# Patient Record
Sex: Female | Born: 1960 | Race: White | Hispanic: No | Marital: Married | Smoking: Current some day smoker
Health system: Southern US, Community
[De-identification: ages and names within clinical notes are randomized; demographics above are authoritative.]

## PROBLEM LIST (undated history)

## (undated) DIAGNOSIS — M797 Fibromyalgia: Secondary | ICD-10-CM

## (undated) DIAGNOSIS — K659 Peritonitis, unspecified: Secondary | ICD-10-CM

## (undated) DIAGNOSIS — I340 Nonrheumatic mitral (valve) insufficiency: Secondary | ICD-10-CM

## (undated) DIAGNOSIS — E041 Nontoxic single thyroid nodule: Secondary | ICD-10-CM

## (undated) DIAGNOSIS — F449 Dissociative and conversion disorder, unspecified: Secondary | ICD-10-CM

## (undated) DIAGNOSIS — F32A Depression, unspecified: Secondary | ICD-10-CM

## (undated) DIAGNOSIS — G4730 Sleep apnea, unspecified: Secondary | ICD-10-CM

## (undated) DIAGNOSIS — R87629 Unspecified abnormal cytological findings in specimens from vagina: Secondary | ICD-10-CM

## (undated) HISTORY — PX: ANTERIOR / POSTERIOR COMBINED FUSION CERVICAL SPINE: SUR627

## (undated) HISTORY — DX: Sleep apnea, unspecified: G47.30

## (undated) HISTORY — PX: BREAST BIOPSY: SHX20

## (undated) HISTORY — PX: ANTERIOR LAT LUMBAR FUSION: SHX1168

## (undated) HISTORY — PX: NOSE SURGERY: SHX723

## (undated) HISTORY — PX: ABDOMINAL HYSTERECTOMY: SHX81

## (undated) HISTORY — DX: Nonrheumatic mitral (valve) insufficiency: I34.0

## (undated) HISTORY — PX: TUMOR REMOVAL: SHX12

## (undated) HISTORY — PX: INGUINAL HERNIA REPAIR: SUR1180

---

## 2012-03-06 DIAGNOSIS — J45909 Unspecified asthma, uncomplicated: Secondary | ICD-10-CM | POA: Insufficient documentation

## 2014-03-06 DIAGNOSIS — G43909 Migraine, unspecified, not intractable, without status migrainosus: Secondary | ICD-10-CM | POA: Insufficient documentation

## 2014-03-06 DIAGNOSIS — I10 Essential (primary) hypertension: Secondary | ICD-10-CM | POA: Insufficient documentation

## 2014-08-06 DIAGNOSIS — G4733 Obstructive sleep apnea (adult) (pediatric): Secondary | ICD-10-CM | POA: Insufficient documentation

## 2017-06-28 DIAGNOSIS — K760 Fatty (change of) liver, not elsewhere classified: Secondary | ICD-10-CM | POA: Insufficient documentation

## 2018-03-29 DIAGNOSIS — E782 Mixed hyperlipidemia: Secondary | ICD-10-CM | POA: Insufficient documentation

## 2018-03-29 DIAGNOSIS — E559 Vitamin D deficiency, unspecified: Secondary | ICD-10-CM | POA: Insufficient documentation

## 2018-03-29 DIAGNOSIS — F324 Major depressive disorder, single episode, in partial remission: Secondary | ICD-10-CM | POA: Insufficient documentation

## 2018-03-29 DIAGNOSIS — R7301 Impaired fasting glucose: Secondary | ICD-10-CM | POA: Insufficient documentation

## 2018-06-06 DIAGNOSIS — M519 Unspecified thoracic, thoracolumbar and lumbosacral intervertebral disc disorder: Secondary | ICD-10-CM | POA: Insufficient documentation

## 2018-07-12 DIAGNOSIS — E669 Obesity, unspecified: Secondary | ICD-10-CM | POA: Insufficient documentation

## 2020-12-10 DIAGNOSIS — K5904 Chronic idiopathic constipation: Secondary | ICD-10-CM | POA: Insufficient documentation

## 2021-03-13 DIAGNOSIS — M546 Pain in thoracic spine: Secondary | ICD-10-CM | POA: Insufficient documentation

## 2021-03-25 DIAGNOSIS — R079 Chest pain, unspecified: Secondary | ICD-10-CM | POA: Insufficient documentation

## 2021-03-25 DIAGNOSIS — R030 Elevated blood-pressure reading, without diagnosis of hypertension: Secondary | ICD-10-CM | POA: Insufficient documentation

## 2021-03-25 DIAGNOSIS — M62830 Muscle spasm of back: Secondary | ICD-10-CM | POA: Insufficient documentation

## 2021-03-27 DIAGNOSIS — M9900 Segmental and somatic dysfunction of head region: Secondary | ICD-10-CM | POA: Insufficient documentation

## 2021-03-27 DIAGNOSIS — M9901 Segmental and somatic dysfunction of cervical region: Secondary | ICD-10-CM | POA: Insufficient documentation

## 2021-03-27 DIAGNOSIS — M9907 Segmental and somatic dysfunction of upper extremity: Secondary | ICD-10-CM | POA: Insufficient documentation

## 2021-04-10 DIAGNOSIS — M25512 Pain in left shoulder: Secondary | ICD-10-CM | POA: Insufficient documentation

## 2021-07-28 DIAGNOSIS — Z1331 Encounter for screening for depression: Secondary | ICD-10-CM | POA: Insufficient documentation

## 2021-10-13 DIAGNOSIS — J0110 Acute frontal sinusitis, unspecified: Secondary | ICD-10-CM | POA: Insufficient documentation

## 2022-01-27 ENCOUNTER — Ambulatory Visit: Payer: Self-pay | Admitting: *Deleted

## 2022-01-27 NOTE — Telephone Encounter (Signed)
Patient experiencing for 2 months feeling nausea,  experiencing service constipation possible infection pylori. Reason for Disposition  Nausea lasts > 1 week  Answer Assessment - Initial Assessment Questions 1. NAUSEA SEVERITY: "How bad is the nausea?" (e.g., mild, moderate, severe; dehydration, weight loss)   - MILD: loss of appetite without change in eating habits   - MODERATE: decreased oral intake without significant weight loss, dehydration, or malnutrition   - SEVERE: inadequate caloric or fluid intake, significant weight loss, symptoms of dehydration     moderate 2. ONSET: "When did the nausea begin?"     2 months 3. VOMITING: "Any vomiting?" If Yes, ask: "How many times today?"     Yes- not daily- once /week 4. RECURRENT SYMPTOM: "Have you had nausea before?" If Yes, ask: "When was the last time?" "What happened that time?"     H pylori 5. CAUSE: "What do you think is causing the nausea?"     constipation 6. PREGNANCY: "Is there any chance you are pregnant?" (e.g., unprotected intercourse, missed birth control pill, broken condom)     *No Answer*  Protocols used: Nausea-A-AH

## 2022-01-27 NOTE — Telephone Encounter (Signed)
°  Chief Complaint: nausea Symptoms: nausea, constipation, vomiting Frequency: 2 months Pertinent Negatives: Patient denies fever Disposition: [] ED /[x] Urgent Care (no appt availability in office) / [] Appointment(In office/virtual)/ []  [place] [facility]/ [] Home Care/ [] Refused Recommended Disposition /[] [place] [facility]/ []  Follow-up with PCP Additional Notes: Patient is new to area and has not established care- advised until she can be seen- UC - patient will check with her insurance and see what is covered

## 2022-04-08 DIAGNOSIS — R399 Unspecified symptoms and signs involving the genitourinary system: Secondary | ICD-10-CM | POA: Insufficient documentation

## 2022-04-08 DIAGNOSIS — F419 Anxiety disorder, unspecified: Secondary | ICD-10-CM | POA: Insufficient documentation

## 2022-04-08 DIAGNOSIS — F5101 Primary insomnia: Secondary | ICD-10-CM | POA: Insufficient documentation

## 2022-04-08 DIAGNOSIS — N319 Neuromuscular dysfunction of bladder, unspecified: Secondary | ICD-10-CM | POA: Insufficient documentation

## 2022-04-08 DIAGNOSIS — J309 Allergic rhinitis, unspecified: Secondary | ICD-10-CM | POA: Insufficient documentation

## 2022-04-08 DIAGNOSIS — K219 Gastro-esophageal reflux disease without esophagitis: Secondary | ICD-10-CM | POA: Insufficient documentation

## 2022-04-08 DIAGNOSIS — F410 Panic disorder [episodic paroxysmal anxiety] without agoraphobia: Secondary | ICD-10-CM | POA: Insufficient documentation

## 2022-04-08 DIAGNOSIS — F1320 Sedative, hypnotic or anxiolytic dependence, uncomplicated: Secondary | ICD-10-CM | POA: Insufficient documentation

## 2022-04-08 DIAGNOSIS — G4700 Insomnia, unspecified: Secondary | ICD-10-CM | POA: Insufficient documentation

## 2022-04-08 DIAGNOSIS — F331 Major depressive disorder, recurrent, moderate: Secondary | ICD-10-CM | POA: Insufficient documentation

## 2022-04-16 ENCOUNTER — Emergency Department
Admission: EM | Admit: 2022-04-16 | Discharge: 2022-04-16 | Disposition: A | Discharge status: Home / Self Care | Payer: Medicare Other | Attending: Emergency Medicine | Admitting: Emergency Medicine

## 2022-04-16 ENCOUNTER — Other Ambulatory Visit: Payer: Self-pay

## 2022-04-16 ENCOUNTER — Emergency Department: Payer: Medicare Other

## 2022-04-16 DIAGNOSIS — R220 Localized swelling, mass and lump, head: Secondary | ICD-10-CM | POA: Insufficient documentation

## 2022-04-16 DIAGNOSIS — Z90710 Acquired absence of both cervix and uterus: Secondary | ICD-10-CM | POA: Diagnosis not present

## 2022-04-16 DIAGNOSIS — F32A Depression, unspecified: Secondary | ICD-10-CM | POA: Diagnosis not present

## 2022-04-16 DIAGNOSIS — R102 Pelvic and perineal pain: Secondary | ICD-10-CM | POA: Diagnosis not present

## 2022-04-16 DIAGNOSIS — Z79899 Other long term (current) drug therapy: Secondary | ICD-10-CM | POA: Diagnosis not present

## 2022-04-16 DIAGNOSIS — R42 Dizziness and giddiness: Secondary | ICD-10-CM | POA: Insufficient documentation

## 2022-04-16 DIAGNOSIS — R300 Dysuria: Secondary | ICD-10-CM | POA: Insufficient documentation

## 2022-04-16 DIAGNOSIS — M797 Fibromyalgia: Secondary | ICD-10-CM | POA: Diagnosis not present

## 2022-04-16 HISTORY — DX: Fibromyalgia: M79.7

## 2022-04-16 HISTORY — DX: Nontoxic single thyroid nodule: E04.1

## 2022-04-16 HISTORY — DX: Peritonitis, unspecified: K65.9

## 2022-04-16 HISTORY — DX: Unspecified abnormal cytological findings in specimens from vagina: R87.629

## 2022-04-16 HISTORY — DX: Depression, unspecified: F32.A

## 2022-04-16 HISTORY — DX: Dissociative and conversion disorder, unspecified: F44.9

## 2022-04-16 LAB — BASIC METABOLIC PANEL
Anion gap: 8 (ref 5–15)
BUN: 26 mg/dL — ABNORMAL HIGH (ref 8–23)
CO2: 26 mmol/L (ref 22–32)
Calcium: 9.5 mg/dL (ref 8.9–10.3)
Chloride: 104 mmol/L (ref 98–111)
Creatinine, Ser: 0.85 mg/dL (ref 0.44–1.00)
GFR, Estimated: >60 mL/min (ref >60)
Glucose, Bld: 113 mg/dL — ABNORMAL HIGH (ref 70–99)
Potassium: 4.2 mmol/L (ref 3.5–5.1)
Sodium: 138 mmol/L (ref 135–145)

## 2022-04-16 LAB — CBC
HCT: 39 % (ref 36.0–46.0)
Hemoglobin: 12.8 g/dL (ref 12.0–15.0)
MCH: 29.2 pg (ref 26.0–34.0)
MCHC: 32.8 g/dL (ref 30.0–36.0)
MCV: 89 fL (ref 80.0–100.0)
Platelets: 417 10*3/uL — ABNORMAL HIGH (ref 150–400)
RBC: 4.38 MIL/uL (ref 3.87–5.11)
RDW: 13 % (ref 11.5–15.5)
WBC: 6.6 10*3/uL (ref 4.0–10.5)
nRBC: 0 % (ref 0.0–0.2)

## 2022-04-16 LAB — URINALYSIS, ROUTINE W REFLEX MICROSCOPIC
Bilirubin Urine: NEGATIVE
Glucose, UA: NEGATIVE mg/dL
Hgb urine dipstick: NEGATIVE
Ketones, ur: NEGATIVE mg/dL
Leukocytes,Ua: NEGATIVE
Nitrite: NEGATIVE
Protein, ur: NEGATIVE mg/dL
Specific Gravity, Urine: 1.01 (ref 1.005–1.030)
pH: 6 (ref 5.0–8.0)

## 2022-04-16 LAB — TRIGLYCERIDES: Triglycerides: 112 mg/dL (ref <150)

## 2022-04-16 MED ORDER — IOHEXOL 300 MG/ML  SOLN
100.0000 mL | Freq: Once | INTRAMUSCULAR | Status: AC | PRN
  Administered 2022-04-16: 100 mL via INTRAVENOUS
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
## 2022-04-16 NOTE — Discharge Instructions (Addendum)
Your lab tests and CT scan of the abdomen and pelvis were all okay today. ?

## 2022-04-16 NOTE — ED Triage Notes (Signed)
Pt here with dizziness that started 2 weeks ago but has gotten worse in the last 2 days. Pt also having urethra pain. Pt states she has a sudden short term memory loss and lower pelvic pain. Pt believes she had a UTI but her tests have come back normal. Pt also concerned about a bump on her forehead that has gotten larger and is hard. ?

## 2022-04-17 LAB — HEMOGLOBIN A1C
Hgb A1c MFr Bld: 6 % — ABNORMAL HIGH (ref 4.8–5.6)
Mean Plasma Glucose: 125.5 mg/dL

## 2022-04-17 LAB — LIPID PANEL
Cholesterol: 261 mg/dL — ABNORMAL HIGH (ref 0–200)
HDL: 70 mg/dL (ref >40)
LDL Cholesterol: 168 mg/dL — ABNORMAL HIGH (ref 0–99)
Total CHOL/HDL Ratio: 3.7 RATIO
Triglycerides: 114 mg/dL (ref <150)
VLDL: 23 mg/dL (ref 0–40)

## 2022-04-17 LAB — TSH: TSH: 0.859 u[IU]/mL (ref 0.350–4.500)

## 2022-05-18 ENCOUNTER — Other Ambulatory Visit: Payer: Self-pay | Admitting: Internal Medicine

## 2022-05-18 DIAGNOSIS — Z1231 Encounter for screening mammogram for malignant neoplasm of breast: Secondary | ICD-10-CM

## 2022-05-21 ENCOUNTER — Encounter: Payer: Self-pay | Admitting: Licensed Practical Nurse

## 2022-05-24 ENCOUNTER — Encounter: Payer: Self-pay | Admitting: Obstetrics

## 2022-05-24 ENCOUNTER — Other Ambulatory Visit (HOSPITAL_COMMUNITY)
Admission: RE | Admit: 2022-05-24 | Discharge: 2022-05-24 | Disposition: A | Discharge status: Home / Self Care | Payer: Medicare Other | Source: Ambulatory Visit | Attending: Obstetrics | Admitting: Obstetrics

## 2022-05-24 ENCOUNTER — Ambulatory Visit: Payer: Medicare Other | Admitting: Obstetrics

## 2022-05-24 VITALS — BP 122/82 | Resp 16 | Ht 63.0 in | Wt 185.2 lb

## 2022-05-24 DIAGNOSIS — Z8742 Personal history of other diseases of the female genital tract: Secondary | ICD-10-CM | POA: Diagnosis not present

## 2022-05-24 DIAGNOSIS — Z1231 Encounter for screening mammogram for malignant neoplasm of breast: Secondary | ICD-10-CM

## 2022-05-24 DIAGNOSIS — N6310 Unspecified lump in the right breast, unspecified quadrant: Secondary | ICD-10-CM | POA: Diagnosis not present

## 2022-05-24 DIAGNOSIS — R3989 Other symptoms and signs involving the genitourinary system: Secondary | ICD-10-CM

## 2022-05-24 DIAGNOSIS — R7303 Prediabetes: Secondary | ICD-10-CM

## 2022-05-24 DIAGNOSIS — T283XXA Burn of internal genitourinary organs, initial encounter: Secondary | ICD-10-CM | POA: Diagnosis not present

## 2022-05-24 DIAGNOSIS — Z90711 Acquired absence of uterus with remaining cervical stump: Secondary | ICD-10-CM | POA: Insufficient documentation

## 2022-05-24 DIAGNOSIS — R9389 Abnormal findings on diagnostic imaging of other specified body structures: Secondary | ICD-10-CM | POA: Diagnosis not present

## 2022-05-24 DIAGNOSIS — Z124 Encounter for screening for malignant neoplasm of cervix: Secondary | ICD-10-CM | POA: Diagnosis present

## 2022-05-24 DIAGNOSIS — R102 Pelvic and perineal pain: Secondary | ICD-10-CM | POA: Insufficient documentation

## 2022-05-24 DIAGNOSIS — N644 Mastodynia: Secondary | ICD-10-CM

## 2022-05-24 DIAGNOSIS — Z01411 Encounter for gynecological examination (general) (routine) with abnormal findings: Secondary | ICD-10-CM

## 2022-05-24 DIAGNOSIS — Z1151 Encounter for screening for human papillomavirus (HPV): Secondary | ICD-10-CM | POA: Insufficient documentation

## 2022-05-24 DIAGNOSIS — Z87898 Personal history of other specified conditions: Secondary | ICD-10-CM

## 2022-05-24 DIAGNOSIS — Z809 Family history of malignant neoplasm, unspecified: Secondary | ICD-10-CM

## 2022-05-24 LAB — POCT URINALYSIS DIPSTICK
Bilirubin, UA: NEGATIVE
Blood, UA: NEGATIVE
Glucose, UA: NEGATIVE
Ketones, UA: NEGATIVE
Leukocytes, UA: NEGATIVE
Nitrite, UA: NEGATIVE
Protein, UA: NEGATIVE
Spec Grav, UA: <=1.005 — AB (ref 1.010–1.025)
Urobilinogen, UA: 0.2 E.U./dL
pH, UA: 6 (ref 5.0–8.0)

## 2022-05-24 MED ORDER — FLUCONAZOLE 150 MG PO TABS: 150.0000 mg | ORAL_TABLET | Freq: Once | ORAL | 0 refills | Status: AC
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
## 2022-05-24 NOTE — Patient Instructions (Signed)
Have a great year! Please call with any concerns. Don't forget to wear your seatbelt everyday! If you are not signed up on MyChart, please ask [place] how to sign up for it!   In a world where you can be anything, please be kind.   There is no height or weight on file to calculate BMI.  A Healthy Lifestyle: Care Instructions Your Care Instructions  A healthy lifestyle can help you feel good, stay at a healthy weight, and have plenty of energy for both work and play. A healthy lifestyle is something you can share with your whole family. A healthy lifestyle also can lower your risk for serious health problems, such as high blood pressure, heart disease, and diabetes. You can follow a few steps listed below to improve your health and the health of your family. Follow-up care is a key part of your treatment and safety. Be sure to make and go to all appointments, and call your doctor if you are having problems. It's also a good idea to know your test results and keep a list of the medicines you take. How can you care for yourself at home? Do not eat too much sugar, fat, or fast foods. You can still have dessert and treats now and then. The goal is moderation. Start small to improve your eating habits. Pay attention to portion sizes, drink less juice and soda pop, and eat more fruits and vegetables. Eat a healthy amount of food. A 3-ounce serving of meat, for example, is about the size of a deck of cards. Fill the rest of your plate with vegetables and whole grains. Limit the amount of soda and sports drinks you have every day. Drink more water when you are thirsty. Eat at least 5 servings of fruits and vegetables every day. It may seem like a lot, but it is not hard to reach this goal. A serving or helping is 1 piece of fruit, 1 cup of vegetables, or 2 cups of leafy, raw vegetables. Have an apple or some carrot sticks as an afternoon snack instead of a candy bar. Try to have fruits and/or vegetables at  every meal. Make exercise part of your daily routine. You may want to start with simple activities, such as walking, bicycling, or slow swimming. Try to be active 30 to 60 minutes every day. You do not need to do all 30 to 60 minutes all at once. For example, you can exercise 3 times a day for 10 or 20 minutes. Moderate exercise is safe for most people, but it is always a good idea to talk to your doctor before starting an exercise program. Keep moving. Mow the lawn, work in the garden, or [employer]. Take the stairs instead of the elevator at work. If you smoke, quit. People who smoke have an increased risk for heart attack, stroke, cancer, and other lung illnesses. Quitting is hard, but there are ways to boost your chance of quitting tobacco for good. Use nicotine gum, patches, or lozenges. Ask your doctor about stop-smoking programs and medicines. Keep trying. In addition to reducing your risk of diseases in the future, you will notice some benefits soon after you stop using tobacco. If you have shortness of breath or asthma symptoms, they will likely get better within a few weeks after you quit. Limit how much alcohol you drink. Moderate amounts of alcohol (up to 2 drinks a day for men, 1 drink a day for women) are okay. But drinking  too much can lead to liver problems, high blood pressure, and other health problems. Family health If you have a family, there are many things you can do together to improve your health. Eat meals together as a family as often as possible. Eat healthy foods. This includes fruits, vegetables, lean meats and dairy, and whole grains. Include your family in your fitness plan. Most people think of activities such as jogging or tennis as the way to fitness, but there are many ways you and your family can be more active. Anything that makes you breathe hard and gets your heart pumping is exercise. Here are some tips: Walk to do errands or to take your child to school or  the bus. Go for a family bike ride after dinner instead of watching TV. Care instructions adapted under license by your healthcare professional. This care instruction is for use with your licensed healthcare professional. If you have questions about a medical condition or this instruction, always ask your healthcare professional. [place] any warranty or liability for your use of this information.

## 2022-05-25 ENCOUNTER — Telehealth: Payer: Self-pay

## 2022-05-25 NOTE — Telephone Encounter (Signed)
CALLED PATIENT NO ANSWER LEFT VOICEMAIL FOR A CALL BACK ? ?

## 2022-05-26 LAB — CYTOLOGY - PAP
Comment: NEGATIVE
Diagnosis: NEGATIVE
High risk HPV: NEGATIVE

## 2022-05-27 ENCOUNTER — Telehealth: Payer: Self-pay

## 2022-05-27 LAB — NUSWAB VAGINITIS (VG)
Candida albicans, NAA: NEGATIVE
Candida glabrata, NAA: NEGATIVE
Trich vag by NAA: NEGATIVE

## 2022-05-27 NOTE — Telephone Encounter (Signed)
PHONE NOTE

## 2022-05-27 NOTE — Telephone Encounter (Signed)
Per [facility], pt has medicare which won't cover her genetic testing since she personally doesn't have a hx of cancer. Pt aware.

## 2022-05-31 LAB — MYCOPLASMA / UREAPLASMA CULTURE
Mycoplasma hominis Culture: NEGATIVE
Ureaplasma urealyticum: NEGATIVE

## 2022-06-02 ENCOUNTER — Ambulatory Visit
Admission: RE | Admit: 2022-06-02 | Discharge: 2022-06-02 | Disposition: A | Discharge status: Home / Self Care | Payer: Medicare Other | Source: Ambulatory Visit | Attending: Obstetrics | Admitting: Obstetrics

## 2022-06-02 DIAGNOSIS — R3989 Other symptoms and signs involving the genitourinary system: Secondary | ICD-10-CM | POA: Diagnosis present

## 2022-06-03 ENCOUNTER — Telehealth: Payer: Self-pay

## 2022-06-03 NOTE — Telephone Encounter (Signed)
CALLED PATIENT NO ANSWER LEFT VOICEMAIL FOR A CALL BACK ? ?

## 2022-06-24 ENCOUNTER — Telehealth: Payer: Self-pay

## 2022-06-24 NOTE — Telephone Encounter (Signed)
Pt aware.

## 2022-06-28 ENCOUNTER — Encounter: Payer: Self-pay | Admitting: Urology

## 2022-06-28 ENCOUNTER — Ambulatory Visit: Payer: Medicare Other | Admitting: Urology

## 2022-06-28 VITALS — BP 172/84 | HR 89 | Ht 63.0 in | Wt 184.0 lb

## 2022-06-28 DIAGNOSIS — F329 Major depressive disorder, single episode, unspecified: Secondary | ICD-10-CM | POA: Insufficient documentation

## 2022-06-28 DIAGNOSIS — R300 Dysuria: Secondary | ICD-10-CM

## 2022-06-28 DIAGNOSIS — T7422XA Child sexual abuse, confirmed, initial encounter: Secondary | ICD-10-CM | POA: Insufficient documentation

## 2022-06-28 DIAGNOSIS — Z8659 Personal history of other mental and behavioral disorders: Secondary | ICD-10-CM | POA: Insufficient documentation

## 2022-06-28 LAB — MICROSCOPIC EXAMINATION
Bacteria, UA: NONE SEEN
Epithelial Cells (non renal): NONE SEEN /hpf (ref 0–10)

## 2022-06-28 LAB — URINALYSIS, COMPLETE
Bilirubin, UA: NEGATIVE
Glucose, UA: NEGATIVE
Ketones, UA: NEGATIVE
Leukocytes,UA: NEGATIVE
Nitrite, UA: NEGATIVE
Protein,UA: NEGATIVE
RBC, UA: NEGATIVE
Specific Gravity, UA: <1.005 — ABNORMAL LOW (ref 1.005–1.030)
Urobilinogen, Ur: 0.2 mg/dL (ref 0.2–1.0)
pH, UA: 6.5 (ref 5.0–7.5)

## 2022-06-28 MED ORDER — ESTRADIOL 0.1 MG/GM VA CREA: TOPICAL_CREAM | VAGINAL | 12 refills | Status: DC

## 2022-06-28 NOTE — Patient Instructions (Addendum)
Estrogen Cream Instruction  Discard applicator  Apply pea sized amount to tip of finger to urethra before bed. Wash hands well after application. Use Monday, Wednesday and Friday   Cystoscopy Cystoscopy is a procedure that is used to help diagnose and sometimes treat conditions that affect the lower urinary tract. The lower urinary tract includes the bladder and the urethra. The urethra is the tube that drains urine from the bladder. Cystoscopy is done using a thin, tube-shaped instrument with a light and camera at the end (cystoscope). The cystoscope may be hard or flexible, depending on the goal of the procedure. The cystoscope is inserted through the urethra, into the bladder. Cystoscopy may be recommended if you have: Urinary tract infections that keep coming back. Blood in the urine (hematuria). An inability to control when you urinate (urinary incontinence) or an overactive bladder. Unusual cells found in a urine sample. A blockage in the urethra, such as a urinary stone. Painful urination. An abnormality in the bladder found during an intravenous pyelogram (IVP) or CT scan. What are the risks? Generally, this is a safe procedure. However, problems may occur, including: Infection. Bleeding.  What happens during the procedure?  You will be given one or more of the following: A medicine to numb the area (local anesthetic). The area around the opening of your urethra will be cleaned. The cystoscope will be passed through your urethra into your bladder. Germ-free (sterile) fluid will flow through the cystoscope to fill your bladder. The fluid will stretch your bladder so that your health care provider can clearly examine your bladder walls. Your doctor will look at the urethra and bladder. The cystoscope will be removed The procedure may vary among health care providers  What can I expect after the procedure? After the procedure, it is common to have: Some soreness or pain in your  urethra. Urinary symptoms. These include: Mild pain or burning when you urinate. Pain should stop within a few minutes after you urinate. This may last for up to a few days after the procedure. A small amount of blood in your urine for several days. Feeling like you need to urinate but producing only a small amount of urine. Follow these instructions at home: General instructions Return to your normal activities as told by your health care provider.  Drink plenty of fluids after the procedure. Keep all follow-up visits as told by your health care provider. This is important. Contact a health care provider if you: Have pain that gets worse or does not get better with medicine, especially pain when you urinate lasting longer than 72 hours after the procedure. Have trouble urinating. Get help right away if you: Have blood clots in your urine. Have a fever or chills. Are unable to urinate. Summary Cystoscopy is a procedure that is used to help diagnose and sometimes treat conditions that affect the lower urinary tract. Cystoscopy is done using a thin, tube-shaped instrument with a light and camera at the end. After the procedure, it is common to have some soreness or pain in your urethra. It is normal to have blood in your urine after the procedure.  If you were prescribed an antibiotic medicine, take it as told by your health care provider.  This information is not intended to replace advice given to you by your health care provider. Make sure you discuss any questions you have with your health care provider. Document Revised: 11/14/2018 Document Reviewed: 11/14/2018 Elsevier Patient Education  [employer].

## 2022-06-28 NOTE — Progress Notes (Signed)
06/28/2022 9:16 AM   Kaitlyn Frazier [birth year] [id number]  Referring provider: Perrin Maltese, MD [place],  [place] [address]  Chief Complaint  Patient presents with   New Patient (Initial Visit)    ER follow-up bladder pain    HPI: I was consulted to assess the patient for urethral burning.  It lessens when she voids.  It is daily.  It comes and goes.  It is worse after stimulation sexually.  She uses a herbal supplement once a week for but does not feel a lot of dry she has had negative urines  At baseline she leaks sometimes with urgency and coughing sneezing but does not wear a pad.  She voids every 1-2 hours and has rare nocturia.  She has had neck and low back surgery  I do not think she has had bladder surgery when she had her hysterectomy  She had a recent negative pelvic ultrasound and CT scan.  Patient gets nonspecific bloating but not daily that radiates to both sides into her back.  She has a history of fibromyalgia.  Lyrica has been increased in the last month and things have been improving  She is to get bladder infection.  No kidney stones.  Bowel movements normal   PMH: Past Medical History:  Diagnosis Date   Abnormal vaginal Pap smear    Depression    Fibromyalgia    hx of Conversion disorder    hx of Peritonitis (HCC)    Thyroid nodule     Surgical History: Past Surgical History:  Procedure Laterality Date   ABDOMINAL HYSTERECTOMY     ANTERIOR / POSTERIOR COMBINED FUSION CERVICAL SPINE     ANTERIOR LAT LUMBAR FUSION     CESAREAN SECTION     INGUINAL HERNIA REPAIR     NOSE SURGERY     TUMOR REMOVAL     on her SI joint    Home Medications:  Allergies as of 06/28/2022       Reactions   Gabapentin Other (See Comments), Hives, Rash   Severe depression per pt Other reaction(s): confusion, Other Severe depression per pt   Amitriptyline Other (See Comments)   hallucinations   Baclofen Other (See Comments)   Severe depression  per pt   Cyclobenzaprine Other (See Comments)   Latex Hives   Norflex Tablets [orphenadrine]    Pollen Extract Itching   Other Rash        Medication List        Accurate as of June 28, 2022  9:16 AM. If you have any questions, ask your nurse or doctor.          STOP taking these medications    albuterol 108 (90 Base) MCG/ACT inhaler Commonly known as: VENTOLIN HFA Stopped by: Reece Packer, MD   diazepam 5 MG tablet Commonly known as: VALIUM Stopped by: Reece Packer, MD   mupirocin ointment 2 % Commonly known as: BACTROBAN Stopped by: Reece Packer, MD   traZODone 50 MG tablet Commonly known as: DESYREL Stopped by: Reece Packer, MD   Vaginal Suppository Applicator Misc Stopped by: Reece Packer, MD       TAKE these medications    buPROPion 150 MG 12 hr tablet Commonly known as: WELLBUTRIN SR   cetirizine 10 MG tablet Commonly known as: ZYRTEC Take by mouth.   DULoxetine 60 MG capsule Commonly known as: CYMBALTA Take 60 mg by mouth daily.   fluticasone 50 MCG/ACT nasal  spray Commonly known as: FLONASE Place 1 spray into both nostrils daily.   NON FORMULARY daily at 6 (six) AM. P75 Maxx with Retinol   omeprazole 40 MG capsule Commonly known as: PRILOSEC Take 40 mg by mouth daily.   pregabalin 150 MG capsule Commonly known as: LYRICA Take by mouth daily. What changed: Another medication with the same name was removed. Continue taking this medication, and follow the directions you see here. Changed by: Reece Packer, MD   VITAMIN D3 PO Vitamin D3        Allergies:  Allergies  Allergen Reactions   Gabapentin Other (See Comments), Hives and Rash    Severe depression per pt Other reaction(s): confusion, Other Severe depression per pt    Amitriptyline Other (See Comments)    hallucinations   Baclofen Other (See Comments)    Severe depression per pt   Cyclobenzaprine Other (See Comments)   Latex  Hives   Norflex Tablets [Orphenadrine]    Pollen Extract Itching   Other Rash    Family History: Family History  Problem Relation Age of Onset   Uterine cancer Paternal Aunt        40s   Ovarian cancer Paternal Aunt        23s   Ovarian cancer Paternal Aunt        38s   Ovarian cancer Cousin        58s    Social History:  reports that she has never smoked. She has never been exposed to tobacco smoke. She has never used smokeless tobacco. No history on file for alcohol use and drug use.  ROS:                                        Physical Exam: BP (!) 172/84   Pulse 89   Ht '5\' 3"'$  (1.6 m)   Wt 83.5 kg   BMI 32.59 kg/m   Constitutional:  Alert and oriented, No acute distress. HEENT: [place] AT, moist mucus membranes.  Trachea midline, no masses. Cardiovascular: No clubbing, cyanosis, or edema. Respiratory: Normal respiratory effort, no increased work of breathing. GI: Abdomen is soft, nontender, nondistended, no abdominal masses GU: Well supported bladder neck.  No significant atrophy.  No tenderness Skin: No rashes, bruises or suspicious lesions. Lymph: No cervical or inguinal adenopathy. Neurologic: Grossly intact, no focal deficits, moving all 4 extremities. Psychiatric: Normal mood and affect.  Laboratory Data: Lab Results  Component Value Date   WBC 6.6 04/16/2022   HGB 12.8 04/16/2022   HCT 39.0 04/16/2022   MCV 89.0 04/16/2022   PLT 417 (H) 04/16/2022    Lab Results  Component Value Date   CREATININE 0.85 04/16/2022    No results found for: "PSA"  No results found for: "TESTOSTERONE"  Lab Results  Component Value Date   HGBA1C 6.0 (H) 04/16/2022    Urinalysis    Component Value Date/Time   COLORURINE STRAW (A) 04/16/2022 1827   APPEARANCEUR CLEAR (A) 04/16/2022 1827   LABSPEC 1.010 04/16/2022 1827   PHURINE 6.0 04/16/2022 1827   GLUCOSEU NEGATIVE 04/16/2022 1827   HGBUR NEGATIVE 04/16/2022 1827   BILIRUBINUR negative  05/24/2022 [place] 04/16/2022 1827   PROTEINUR Negative 05/24/2022 [place] 04/16/2022 1827   UROBILINOGEN 0.2 05/24/2022 1425   NITRITE negative 05/24/2022 1425   NITRITE NEGATIVE 04/16/2022 1827  LEUKOCYTESUR Negative 05/24/2022 1425   LEUKOCYTESUR NEGATIVE 04/16/2022 1827    Pertinent Imaging: Urine reviewed.  Urine sent for culture.  I did not see cultures in the medical record other than a negative mycoplasma.  Chart reviewed  Assessment & Plan: Patient may have urethritis but certainly it is likely vaginal dryness.  The role of local estrogen cream and to come back for cystoscopy described.  I will send in the local estrogen cream and she will come back for cystoscopy.  In my opinion any potential scar tissue from her previous hernia operation is not the cause of her vaginal and urethral symptoms.  I addressed symptoms regarding stimulation from her husband.  I answered many questions.  I think she was hopeful she was going to have cystoscopy today.  Hopefully the estrogen cream will greatly improve her symptoms.  I did mention the diagnosis of urethritis with the future treatment being anti-inflammatories and diet modification  There are no diagnoses linked to this encounter.  No follow-ups on file.  Reece Packer, MD  [facility] [address] [place],  [address] [phone]

## 2022-07-01 LAB — CULTURE, URINE COMPREHENSIVE

## 2022-07-26 ENCOUNTER — Ambulatory Visit: Payer: Self-pay | Admitting: Urology

## 2022-08-04 ENCOUNTER — Ambulatory Visit (INDEPENDENT_AMBULATORY_CARE_PROVIDER_SITE_OTHER): Payer: Medicare Other | Admitting: Psychiatry

## 2022-08-04 ENCOUNTER — Encounter: Payer: Self-pay | Admitting: Psychiatry

## 2022-08-04 VITALS — BP 144/73 | HR 83 | Temp 98.5°F | Wt 176.8 lb

## 2022-08-04 DIAGNOSIS — Z630 Problems in relationship with spouse or partner: Secondary | ICD-10-CM

## 2022-08-04 DIAGNOSIS — F419 Anxiety disorder, unspecified: Secondary | ICD-10-CM

## 2022-08-04 DIAGNOSIS — G4700 Insomnia, unspecified: Secondary | ICD-10-CM | POA: Diagnosis not present

## 2022-08-04 DIAGNOSIS — F4310 Post-traumatic stress disorder, unspecified: Secondary | ICD-10-CM

## 2022-08-04 DIAGNOSIS — Z8659 Personal history of other mental and behavioral disorders: Secondary | ICD-10-CM

## 2022-08-04 MED ORDER — DULOXETINE HCL 30 MG PO CPEP: 30.0000 mg | ORAL_CAPSULE | Freq: Every day | ORAL | 0 refills | Status: DC

## 2022-08-04 MED ORDER — BUSPIRONE HCL 10 MG PO TABS: 10.0000 mg | ORAL_TABLET | Freq: Three times a day (TID) | ORAL | 1 refills | Status: DC
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
## 2022-08-04 NOTE — Patient Instructions (Addendum)
[facility]  [place] -  [phone]  [facility] - [phone].  [place], [place] - [phone]  [email or web address]  www.psychologytoday   [facility] [phone]   [place] [phone]  [address] [facility] [phone]              Buspirone Tablets What is this medication? BUSPIRONE (byoo SPYE rone) treats anxiety. It works by balancing the levels of dopamine and serotonin in your brain, hormones that help regulate mood. This medicine may be used for other purposes; ask your health care provider or pharmacist if you have questions. COMMON BRAND NAME(S): BuSpar, Buspar Dividose What should I tell my care team before I take this medication? They need to know if you have any of these conditions: Kidney or liver disease An unusual or allergic reaction to buspirone, other medications, foods, dyes, or preservatives Pregnant or trying to get pregnant Breast-feeding How should I use this medication? Take this medication by mouth with a glass of water. Follow the directions on the prescription label. You may take this medication with or without food. To ensure that this medication always works the same way for you, you should take it either always with or always without food. Take your doses at regular intervals. Do not take your medication more often than directed. Do not stop taking except on the advice of your care team. Talk to your care team about the use of this medication in children. Special care may be needed. Overdosage: If you think you have taken too much of this medicine contact a poison control center or emergency room at once. NOTE: This medicine is only for you. Do not share this medicine with others. What if I miss a dose? If you miss a dose, take it as soon as you can. If it is almost time for your next dose, take only that dose. Do not take double or extra doses. What may interact with this  medication? Do not take this medication with any of the following: Linezolid MAOIs like Carbex, Eldepryl, Marplan, Nardil, and Parnate Methylene blue Procarbazine This medication may also interact with the following: Diazepam Digoxin Diltiazem Erythromycin Grapefruit juice Haloperidol Medications for mental depression or mood problems Medications for seizures like carbamazepine, phenobarbital and phenytoin Nefazodone Other medications for anxiety Rifampin Ritonavir Some antifungal medications like itraconazole, ketoconazole, and voriconazole Verapamil Warfarin This list may not describe all possible interactions. Give your health care provider a list of all the medicines, herbs, non-prescription drugs, or dietary supplements you use. Also tell them if you smoke, drink alcohol, or use illegal drugs. Some items may interact with your medicine. What should I watch for while using this medication? Visit your care team for regular checks on your progress. It may take 1 to 2 weeks before your anxiety gets better. You may get drowsy or dizzy. Do not drive, use machinery, or do anything that needs mental alertness until you know how this medication affects you. Do not stand or sit up quickly, especially if you are an older patient. This reduces the risk of dizzy or fainting spells. Alcohol can make you more drowsy and dizzy. Avoid alcoholic drinks. What side effects may I notice from receiving this medication? Side effects that you should report to your care team as soon as possible: Allergic reactions--skin rash, itching, hives, swelling of the face, lips, tongue, or throat Irritability, confusion, fast or irregular heartbeat, muscle stiffness, twitching muscles, sweating, high  fever, seizure, chills, vomiting, diarrhea, which may be signs of serotonin syndrome Side effects that usually do not require medical attention (report to your care team if they continue or are bothersome): Anxiety or  nervousness Dizziness Drowsiness Headache Nausea Trouble sleeping This list may not describe all possible side effects. Call your doctor for medical advice about side effects. You may report side effects to FDA at 1-800-FDA-1088. Where should I keep my medication? Keep out of the reach of children. Store at room temperature below 30 degrees C (86 degrees F). Protect from light. Keep container tightly closed. Throw away any unused medication after the expiration date. NOTE: This sheet is a summary. It may not cover all possible information. If you have questions about this medicine, talk to your doctor, pharmacist, or health care provider.  2023 Elsevier/Gold Standard (2021-02-02 00:00:00)

## 2022-08-05 ENCOUNTER — Encounter: Payer: Self-pay | Admitting: Emergency Medicine

## 2022-08-05 ENCOUNTER — Emergency Department: Payer: No Typology Code available for payment source

## 2022-08-05 ENCOUNTER — Telehealth (HOSPITAL_COMMUNITY): Payer: Self-pay | Admitting: Professional

## 2022-08-05 ENCOUNTER — Other Ambulatory Visit: Payer: Self-pay

## 2022-08-05 ENCOUNTER — Emergency Department
Admission: EM | Admit: 2022-08-05 | Discharge: 2022-08-05 | Disposition: A | Discharge status: Home / Self Care | Payer: No Typology Code available for payment source | Attending: Emergency Medicine | Admitting: Emergency Medicine

## 2022-08-05 DIAGNOSIS — J45909 Unspecified asthma, uncomplicated: Secondary | ICD-10-CM | POA: Diagnosis not present

## 2022-08-05 DIAGNOSIS — Y990 Civilian activity done for income or pay: Secondary | ICD-10-CM | POA: Diagnosis not present

## 2022-08-05 DIAGNOSIS — M542 Cervicalgia: Secondary | ICD-10-CM | POA: Insufficient documentation

## 2022-08-05 DIAGNOSIS — M25561 Pain in right knee: Secondary | ICD-10-CM | POA: Diagnosis not present

## 2022-08-05 DIAGNOSIS — S0993XA Unspecified injury of face, initial encounter: Secondary | ICD-10-CM | POA: Diagnosis not present

## 2022-08-05 DIAGNOSIS — W01198A Fall on same level from slipping, tripping and stumbling with subsequent striking against other object, initial encounter: Secondary | ICD-10-CM | POA: Diagnosis not present

## 2022-08-05 DIAGNOSIS — I10 Essential (primary) hypertension: Secondary | ICD-10-CM | POA: Diagnosis not present

## 2022-08-05 DIAGNOSIS — S60311A Abrasion of right thumb, initial encounter: Secondary | ICD-10-CM | POA: Diagnosis not present

## 2022-08-05 DIAGNOSIS — R519 Headache, unspecified: Secondary | ICD-10-CM | POA: Diagnosis not present

## 2022-08-05 DIAGNOSIS — S6991XA Unspecified injury of right wrist, hand and finger(s), initial encounter: Secondary | ICD-10-CM | POA: Diagnosis present

## 2022-08-05 MED ORDER — KETOROLAC TROMETHAMINE 15 MG/ML IJ SOLN
15.0000 mg | Freq: Once | INTRAMUSCULAR | Status: AC
  Administered 2022-08-05: 15 mg via INTRAMUSCULAR
  Filled 2022-08-05: qty 1
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
## 2022-08-05 NOTE — Discharge Instructions (Signed)
Your CAT scan did not show any broken bones or dislocation of your jaw.  You can take the Aleve as needed for pain and also ice the area

## 2022-08-05 NOTE — ED Notes (Signed)
Patient transported to CT 
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
## 2022-08-13 ENCOUNTER — Other Ambulatory Visit: Payer: Self-pay | Admitting: Physician Assistant

## 2022-08-13 ENCOUNTER — Ambulatory Visit
Admission: RE | Admit: 2022-08-13 | Discharge: 2022-08-13 | Disposition: A | Discharge status: Home / Self Care | Payer: No Typology Code available for payment source | Attending: Physician Assistant | Admitting: Physician Assistant

## 2022-08-13 ENCOUNTER — Ambulatory Visit
Admission: RE | Admit: 2022-08-13 | Discharge: 2022-08-13 | Disposition: A | Discharge status: Home / Self Care | Payer: No Typology Code available for payment source | Source: Ambulatory Visit | Attending: Physician Assistant | Admitting: Physician Assistant

## 2022-08-13 DIAGNOSIS — R0781 Pleurodynia: Secondary | ICD-10-CM | POA: Insufficient documentation

## 2022-08-16 ENCOUNTER — Other Ambulatory Visit: Payer: Medicare Other | Admitting: Urology

## 2022-08-30 ENCOUNTER — Other Ambulatory Visit: Payer: Medicare Other | Admitting: Urology

## 2022-09-17 ENCOUNTER — Encounter: Payer: Self-pay | Admitting: Obstetrics and Gynecology

## 2022-09-17 ENCOUNTER — Ambulatory Visit (INDEPENDENT_AMBULATORY_CARE_PROVIDER_SITE_OTHER): Payer: Medicare Other | Admitting: Obstetrics and Gynecology

## 2022-09-17 VITALS — BP 126/88 | HR 94 | Ht 63.0 in | Wt 180.0 lb

## 2022-09-17 DIAGNOSIS — K5904 Chronic idiopathic constipation: Secondary | ICD-10-CM

## 2022-09-17 DIAGNOSIS — R3989 Other symptoms and signs involving the genitourinary system: Secondary | ICD-10-CM

## 2022-09-17 DIAGNOSIS — R350 Frequency of micturition: Secondary | ICD-10-CM | POA: Diagnosis not present

## 2022-09-17 DIAGNOSIS — N3281 Overactive bladder: Secondary | ICD-10-CM | POA: Diagnosis not present

## 2022-09-17 DIAGNOSIS — M62838 Other muscle spasm: Secondary | ICD-10-CM

## 2022-09-17 DIAGNOSIS — N8110 Cystocele, unspecified: Secondary | ICD-10-CM

## 2022-09-17 DIAGNOSIS — L900 Lichen sclerosus et atrophicus: Secondary | ICD-10-CM

## 2022-09-17 DIAGNOSIS — N816 Rectocele: Secondary | ICD-10-CM

## 2022-09-17 DIAGNOSIS — N393 Stress incontinence (female) (male): Secondary | ICD-10-CM

## 2022-09-17 LAB — POCT URINALYSIS DIPSTICK
Bilirubin, UA: NEGATIVE
Blood, UA: NEGATIVE
Glucose, UA: NEGATIVE
Ketones, UA: NEGATIVE
Leukocytes, UA: NEGATIVE
Nitrite, UA: NEGATIVE
Protein, UA: NEGATIVE
Spec Grav, UA: 1.01 (ref 1.010–1.025)
Urobilinogen, UA: 0.2 E.U./dL
pH, UA: 6.5 (ref 5.0–8.0)

## 2022-09-17 MED ORDER — HYDROXYZINE HCL 25 MG PO TABS: 25.0000 mg | ORAL_TABLET | Freq: Every evening | ORAL | 5 refills | Status: DC

## 2022-09-17 MED ORDER — CLOBETASOL PROP EMOLLIENT BASE 0.05 % EX CREA: 1.0000 g | TOPICAL_CREAM | Freq: Two times a day (BID) | CUTANEOUS | 5 refills | Status: DC

## 2022-09-17 NOTE — Patient Instructions (Addendum)
Start vaginal estrogen therapy nightly for two weeks then 2-3 times weekly at night for treatment of vaginal atrophy (dryness of the vaginal tissues).    Use the clobetasol on the clitoris twice a day for a month, then once a day for a month, then three times a week.   Today we talked about ways to manage bladder urgency such as altering your diet to avoid irritative beverages and foods (bladder diet) as well as attempting to decrease stress and other exacerbating factors.    There is a website with helpful information for people with bladder irritation, called the [place] at https://www.ic-network.com. This website has more information about a healthy bladder diet and patient forums for support.  The Most Bothersome Foods* The Least Bothersome Foods*  Coffee - Regular & Decaf Tea - caffeinated Carbonated beverages - cola, non-colas, diet & caffeine-free Alcohols - Beer, Red Wine, White Wine, Champagne Fruits - Grapefruit, [place], Orange, [employer] - Cranberry, Grapefruit, Orange, Pineapple Vegetables - Tomato & Tomato Products Flavor Enhancers - Hot peppers, Spicy foods, Chili, Horseradish, Vinegar, Monosodium glutamate (MSG) Artificial Sweeteners - NutraSweet, Sweet 'N Low, Equal (sweetener), Saccharin Ethnic foods - [place], [place], [place] food Express Scripts - low-fat & whole Fruits - Bananas, Blueberries, Honeydew melon, Pears, Raisins, Watermelon Vegetables - Broccoli, Brussels Sprouts, Sparks, Carrots, Cauliflower, [place], Cucumber, Mushrooms, Peas, Radishes, Squash, Zucchini, White potatoes, Sweet potatoes & yams Poultry - Chicken, Eggs, [place], [employer] - Beef, Programmer, multimedia, Lamb Seafood - Shrimp, [place] fish, Salmon Grains - Oat, Rice Snacks - Pretzels, Popcorn  *Lissa Morales et al. Diet and its role in interstitial cystitis/bladder pain syndrome (IC/BPS) and comorbid conditions. [place] 2012 Jan 11.   Interstitial Cystitis/ Bladder Pain treatment:  Avoid  irriative foods and beverages and identify other triggers Work to decrease stress- meditation, yoga Medications for bladder pain: pyridium (Azo), methenamine, Uribel Medications: amitiptyline, hydroxyzine (anti-histamine), tums, L-arginine, [place]  Bladder instillations for pain flares Cystoscopy with hydrodistention  The IC Network at https://www.ic-network.com is helpful for bladder diet suggestions and forums for support.  Additional treatments to try for relief of IC symptoms:  Over the counter natural supplements: -Bladder Ease by Vitanica, Bladder Rest or Bladder Builder (all contain L-arginine) - helps to rebuild protective bladder layer and reduce bladder pain -Marshmallow Root (relieves inflammation in the urinary tract) - pills available or drink as a tea -Aloe Vera: [place] Harvest Aloe Vera capsules,  AloePath (also contains L-arginine and calcium cabonate)- can take several months to see improvement -Fish oil - 4 g daily -Tumeric (standardized to 95% curcuminoids) - 500 mg three times daily  -Mindfulness practice (yoga, meditation, deep breathing)   -If constipation as well, Magnesium supplement daily (reduces bladder spasms, helps constipation)) -If IBS as well, try low FODMAP or SIBO diet  - https://www.siboinfo.com/diet.html    Fiber Women should try to eat at least 21 to 25 grams of fiber a day, while men should aim for 30 to 38 grams a day. You can add fiber to your diet with food or a fiber supplement such as psyllium (metamucil), benefiber, or fibercon.   Here's a look at how much dietary fiber is found in some common foods. When buying packaged foods, check the Nutrition Facts label for fiber content. It can vary among brands.  Fruits Serving size Total fiber (grams)*  Raspberries 1 cup 8.0  Pear 1 medium 5.5  Apple, with skin 1 medium 4.5  Banana 1 medium 3.0  Orange 1 medium 3.0  Strawberries 1 cup 3.0   Vegetables Serving size Total fiber (grams)*   Green peas, boiled 1 cup 9.0  Broccoli, boiled 1 cup chopped 5.0  Turnip greens, boiled 1 cup 5.0  Brussels sprouts, boiled 1 cup 4.0  Potato, with skin, baked 1 medium 4.0  Sweet corn, boiled 1 cup 3.5  Cauliflower, raw 1 cup chopped 2.0  Carrot, raw 1 medium 1.5   Grains Serving size Total fiber (grams)*  Spaghetti, whole-wheat, cooked 1 cup 6.0  Barley, pearled, cooked 1 cup 6.0  Bran flakes 3/4 cup 5.5  Quinoa, cooked 1 cup 5.0  Oat bran muffin 1 medium 5.0  Oatmeal, instant, cooked 1 cup 5.0  Popcorn, air-popped 3 cups 3.5  Brown rice, cooked 1 cup 3.5  Bread, whole-wheat 1 slice 2.0  Bread, rye 1 slice 2.0   Legumes, nuts and seeds Serving size Total fiber (grams)*  Split peas, boiled 1 cup 16.0  Lentils, boiled 1 cup 15.5  Black beans, boiled 1 cup 15.0  Baked beans, canned 1 cup 10.0  Chia seeds 1 ounce 10.0  Almonds 1 ounce (23 nuts) 3.5  Pistachios 1 ounce (49 nuts) 3.0  Sunflower kernels 1 ounce 3.0  *Rounded to nearest 0.5 gram. Source: [employer] for [employer], [employer]
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
## 2022-09-22 ENCOUNTER — Other Ambulatory Visit
Admission: RE | Admit: 2022-09-22 | Discharge: 2022-09-22 | Disposition: A | Discharge status: Home / Self Care | Payer: Medicare Other | Source: Ambulatory Visit | Attending: Psychiatry | Admitting: Psychiatry

## 2022-09-22 ENCOUNTER — Ambulatory Visit: Payer: PRIVATE HEALTH INSURANCE | Attending: Obstetrics and Gynecology

## 2022-09-22 ENCOUNTER — Encounter: Payer: Self-pay | Admitting: Psychiatry

## 2022-09-22 ENCOUNTER — Ambulatory Visit (INDEPENDENT_AMBULATORY_CARE_PROVIDER_SITE_OTHER): Payer: Medicare Other | Admitting: Psychiatry

## 2022-09-22 VITALS — BP 146/77 | HR 102 | Temp 98.1°F | Ht 63.0 in | Wt 180.0 lb

## 2022-09-22 DIAGNOSIS — F419 Anxiety disorder, unspecified: Secondary | ICD-10-CM | POA: Insufficient documentation

## 2022-09-22 DIAGNOSIS — M6289 Other specified disorders of muscle: Secondary | ICD-10-CM | POA: Insufficient documentation

## 2022-09-22 DIAGNOSIS — G4700 Insomnia, unspecified: Secondary | ICD-10-CM | POA: Diagnosis not present

## 2022-09-22 DIAGNOSIS — F4310 Post-traumatic stress disorder, unspecified: Secondary | ICD-10-CM | POA: Insufficient documentation

## 2022-09-22 DIAGNOSIS — Z630 Problems in relationship with spouse or partner: Secondary | ICD-10-CM

## 2022-09-22 DIAGNOSIS — M5459 Other low back pain: Secondary | ICD-10-CM | POA: Diagnosis present

## 2022-09-22 DIAGNOSIS — M6281 Muscle weakness (generalized): Secondary | ICD-10-CM | POA: Insufficient documentation

## 2022-09-22 DIAGNOSIS — R278 Other lack of coordination: Secondary | ICD-10-CM | POA: Insufficient documentation

## 2022-09-22 MED ORDER — DOXEPIN HCL 10 MG PO CAPS: 10.0000 mg | ORAL_CAPSULE | Freq: Every evening | ORAL | 0 refills | Status: DC | PRN

## 2022-09-22 MED ORDER — DULOXETINE HCL 30 MG PO CPEP: 60.0000 mg | ORAL_CAPSULE | Freq: Every day | ORAL | 0 refills | Status: DC

## 2022-09-22 NOTE — Progress Notes (Unsigned)
[facility] MD OP Progress Note  09/22/2022 4:00 PM Kaitlyn Frazier  MRN:  [id number]  Chief Complaint:  Chief Complaint  Patient presents with   Follow-up   Depression   Anxiety   Insomnia   HPI: Kaitlyn Frazier is a 61 year old Caucasian female, currently married, on SSD, employed part-time, lives in [place], has a history of PTSD, anxiety unspecified, insomnia unspecified, partner relational problems, functional neurological symptoms disorder, fibromyalgia, degenerative disc disease, nonalcoholic fatty liver, essential hypertension, obstructive sleep apnea was evaluated in office today.  Patient presented for follow-up.  Patient today returns reporting that she had a workplace accident when she was injured after she was chased by a wasp.  Patient however reports although she continues to have pain on her hand-right sided, she is currently awaiting physical therapy, it is improving.  Patient reports she also had COVID-19 infection couple of weeks ago and has recovered since then.  Continues to have relationship struggles with her spouse.  Continues to follow-up with counseling with her employer assistance program.  Agreeable to look for another therapist.  Reports she does struggle with her sleep.  She has tried and failed medications like trazodone, Remeron, Ambien, Elavil.  Agreeable to trial of doxepin.  She also has a history of arthralgia, pain which likely could be also affecting sleep.  Although patient was advised to reduce the dose of Cymbalta last visit to 30 mg and BuSpar was prescribed, patient today returns reporting she stayed on the Cymbalta 60 mg daily.  Patient denies any suicidality, homicidality or perceptual disturbances.  Currently compliant on her medications like Wellbutrin, Cymbalta, denies side effects.  Patient denies any other concerns today.  Visit Diagnosis:    ICD-10-CM   1. PTSD (post-traumatic stress disorder)  F43.10 doxepin (SINEQUAN) 10 MG capsule     DULoxetine (CYMBALTA) 30 MG capsule    2. Anxiety disorder, unspecified type  F41.9 Urine drugs of abuse scrn w alc, routine (Ref Lab)    doxepin (SINEQUAN) 10 MG capsule    3. Insomnia, unspecified type  G47.00 Urine drugs of abuse scrn w alc, routine (Ref Lab)    doxepin (SINEQUAN) 10 MG capsule    4. Partner relational problem  Z63.0       Past Psychiatric History: Reviewed family psychiatric history from progress note on 08/04/2022.  Past trials of medications-multiple-does not remember a lot of names-Prozac, Zoloft, Latuda, Valium.  Past Medical History:  Past Medical History:  Diagnosis Date   Abnormal vaginal Pap smear    Depression    Fibromyalgia    hx of Conversion disorder    hx of Peritonitis (HCC)    Mitral valve regurgitation    Thyroid nodule     Past Surgical History:  Procedure Laterality Date   ABDOMINAL HYSTERECTOMY     ANTERIOR / POSTERIOR COMBINED FUSION CERVICAL SPINE     ANTERIOR LAT LUMBAR FUSION     CESAREAN SECTION     INGUINAL HERNIA REPAIR     NOSE SURGERY     TUMOR REMOVAL     on her SI joint    Family Psychiatric History: Reviewed family psychiatric history from progress note on 08/04/2022.  Family History:  Family History  Problem Relation Age of Onset   Alcohol abuse Mother    Bipolar disorder Mother    Uterine cancer Paternal Aunt        1s   Ovarian cancer Paternal Aunt        91s   Ovarian cancer Paternal  Aunt        34s   Ovarian cancer Cousin        49s   Tourette syndrome Son     Social History: Reviewed social history from progress note on 08/04/2022. Social History   Socioeconomic History   Marital status: Married    Spouse name: Girard Cooter   Number of children: 1   Years of education: Not on file   Highest education level: Master's degree (e.g., MA, MS, MEng, MEd, MSW, MBA)  Occupational History   Not on file  Tobacco Use   Smoking status: Never    Passive exposure: Never   Smokeless tobacco: Never  Vaping Use    Vaping Use: Some days   Substances: Nicotine  Substance and Sexual Activity   Alcohol use: Never   Drug use: Never   Sexual activity: Yes  Other Topics Concern   Not on file  Social History Narrative   Not on file   Social Determinants of Health   Financial Resource Strain: Not on file  Food Insecurity: Not on file  Transportation Needs: Not on file  Physical Activity: Not on file  Stress: Not on file  Social Connections: Not on file    Allergies:  Allergies  Allergen Reactions   Gabapentin Other (See Comments), Hives and Rash    Severe depression per pt Other reaction(s): confusion, Other Severe depression per pt    Amitriptyline Other (See Comments)    hallucinations   Baclofen Other (See Comments)    Severe depression per pt   Cyclobenzaprine Other (See Comments)   Latex Hives   Norflex Tablets [Orphenadrine]    Pollen Extract Itching   Tizanidine Other (See Comments)   Other Rash    Metabolic Disorder Labs: Lab Results  Component Value Date   HGBA1C 6.0 (H) 04/16/2022   MPG 125.5 04/16/2022   No results found for: "PROLACTIN" Lab Results  Component Value Date   CHOL 261 (H) 04/16/2022   TRIG 114 04/16/2022   TRIG 112 04/16/2022   HDL 70 04/16/2022   CHOLHDL 3.7 04/16/2022   VLDL 23 04/16/2022   LDLCALC 168 (H) 04/16/2022   Lab Results  Component Value Date   TSH 0.859 04/16/2022    Therapeutic Level Labs: No results found for: "LITHIUM" No results found for: "VALPROATE" No results found for: "CBMZ"  Current Medications: Current Outpatient Medications  Medication Sig Dispense Refill   bacitracin-polymyxin b (POLYSPORIN) ophthalmic ointment at bedtime.     buPROPion (WELLBUTRIN SR) 150 MG 12 hr tablet Take 150 mg by mouth daily.     Clobetasol Prop Emollient Base (CLOBETASOL PROPIONATE E) 0.05 % emollient cream Apply 2 Applications topically 2 (two) times daily. 60 g 5   doxepin (SINEQUAN) 10 MG capsule Take 1 capsule (10 mg total) by  mouth at bedtime as needed. For sleep 30 capsule 0   doxycycline (VIBRAMYCIN) 100 MG capsule Take 100 mg by mouth 2 (two) times daily.     estradiol (ESTRACE) 0.1 MG/GM vaginal cream Place vaginally.     hydrOXYzine (ATARAX) 25 MG tablet Take 1 tablet (25 mg total) by mouth at bedtime. (Patient not taking: Reported on 09/22/2022) 30 tablet 5   melatonin 3 MG TABS tablet Take 3 mg by mouth at bedtime.     omeprazole (PRILOSEC) 40 MG capsule Take 40 mg by mouth daily.     pregabalin (LYRICA) 150 MG capsule Take by mouth daily.     trimethoprim-polymyxin b (POLYTRIM) ophthalmic solution SMARTSIG:In  Eye(s)     DULoxetine (CYMBALTA) 30 MG capsule Take 2 capsules (60 mg total) by mouth daily. 90 capsule 0   No current facility-administered medications for this visit.     Musculoskeletal: Strength & Muscle Tone: within normal limits Gait & Station: normal Patient leans: N/A  Psychiatric Specialty Exam: Review of Systems  Musculoskeletal:  Positive for arthralgias.       Right sided hand pain  Psychiatric/Behavioral:  Positive for sleep disturbance. The patient is nervous/anxious.   All other systems reviewed and are negative.   Blood pressure (!) 146/77, pulse (!) 102, temperature 98.1 F (36.7 C), temperature source Oral, height '5\' 3"'$  (1.6 m), weight 180 lb (81.6 kg), SpO2 96 %.Body mass index is 31.89 kg/m.  General Appearance: Casual  Eye Contact:  Good  Speech:  Clear and Coherent  Volume:  Normal  Mood:  Anxious  Affect:  Congruent  Thought Process:  Goal Directed and Descriptions of Associations: Intact  Orientation:  Full (Time, Place, and Person)  Thought Content: Logical   Suicidal Thoughts:  No  Homicidal Thoughts:  No  Memory:  Immediate;   Fair Recent;   Fair Remote;   Fair  Judgement:  Fair  Insight:  Fair  Psychomotor Activity:  Normal  Concentration:  Concentration: Fair and Attention Span: Fair  Recall:  [employer] of Knowledge: Fair  Language: Fair   Akathisia:  No  Handed:  Left  AIMS (if indicated): not done  Assets:  Communication Skills Desire for Improvement Housing Social Support  ADL's:  Intact  Cognition: WNL  Sleep:  Poor   Screenings: GAD-7    Flowsheet Row Office Visit from 09/22/2022 in [facility] Office Visit from 08/04/2022 in [facility] Office Visit from 05/24/2022 in [facility]  Total GAD-7 Score '6 6 5      '$ PHQ2-9    [place] Visit from 09/22/2022 in [facility] Office Visit from 08/04/2022 in [facility] Office Visit from 05/24/2022 in [facility]  PHQ-2 Total Score '3 2 2  '$ PHQ-9 Total Score '13 9 12      '$ [place] Visit from 09/22/2022 in [facility] ED from 08/05/2022 in [facility] Office Visit from 08/04/2022 in [facility] No Risk No Risk No Risk        Assessment and Plan: Razan Siler is a 61 year old Caucasian female, currently married, on SSD, part-time employed, lives in [place], has a history of multiple mental health diagnoses including MDD, PTSD, functional neurological symptoms disorder, fibromyalgia, degenerative disc disease, nonalcoholic fatty liver, essential hypertension, obstructive sleep apnea was evaluated in office today.  Patient with some improvement on the current medication regimen continues to struggle with sleep, will benefit from the following plan.  Plan PTSD-some improvement Wellbutrin SR 150 mg p.o. daily Cymbalta 60 mg p.o. daily-patient did not reduce the dosage as discussed last visit to 30 mg Discontinue BuSpar. Start doxepin 10 mg p.o. nightly for sleep Provided education about medication side effects, drug to drug interaction, serotonin syndrome. Referred for CBT again-patient  currently follows up with [facility].  Provided resources in the community.  Partner relationship problem-unstable Referred for CBT   Anxiety disorder unspecified-improving Discontinue BuSpar. Continue Cymbalta 60 mg p.o. daily We will consider adding low dosage of clonazepam as needed for severe panic attacks however will need a urine drug screen prior to  that. She does have hydroxyzine 25 mg available however she does not believe that is beneficial. Reviewed [place] P[facility] AWARxE  Insomnia-unstable Pending sleep study Start doxepin 10 mg p.o. nightly   Follow-up in clinic in 3 to 4 weeks or sooner if needed.  This note was generated in part or whole with voice recognition software. Voice recognition is usually quite accurate but there are transcription errors that can and very often do occur. I apologize for any typographical errors that were not detected and corrected.    Ursula Alert, MD 09/23/2022, 8:36 AM
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
## 2022-09-22 NOTE — Patient Instructions (Addendum)
[email or web address]  [email or web address]  [email or web address] [address], [place], [place] [address]  ~20.2 mi [phone]  [facility], [email or web address] [address] [place], [place] [address]  ~20.9 mi [phone]   [facility] - [phone]  [facility] - [phone]  [facility] [place] [phone]  [facility] [phone]     Doxepin Capsules What is this medication? DOXEPIN (DOX e pin) treats depression and anxiety. It increases the amount of serotonin and norepinephrine in the brain, hormones that help regulate mood. It belongs to a group of medications called tricyclic antidepressants (TCAs). This medicine may be used for other purposes; ask your health care provider or pharmacist if you have questions. COMMON BRAND NAME(S): Sinequan What should I tell my care team before I take this medication? They need to know if you have any of these conditions: Bipolar disorder Difficulty passing urine Glaucoma Heart disease If you frequently drink alcohol containing drinks Liver disease Lung or breathing disease, like asthma or sleep apnea Prostate trouble Schizophrenia Seizures Suicidal thoughts, plans, or attempt; a previous suicide attempt by you or a family member An unusual or allergic reaction to doxepin, other medications, foods, dyes, or preservatives Pregnant or trying to get pregnant Breast-feeding How should I use this medication? Take this medication by mouth with a glass of water. Follow the directions on the prescription label. Take your doses at regular intervals. Do not take your medication more often than directed. Do not stop taking this medication suddenly except upon the advice of your care team. Stopping this medication too quickly may cause serious side effects or your condition may worsen. A special  MedGuide will be given to you by the pharmacist with each prescription and refill. Be sure to read this information carefully each time. Talk to your care team about the use of this medication in children. While this medication may be prescribed for children as young as 12 years for selected conditions, precautions do apply. Overdosage: If you think you have taken too much of this medicine contact a poison control center or emergency room at once. NOTE: This medicine is only for you. Do not share this medicine with others. What if I miss a dose? If you miss a dose, take it as soon as you can. If it is almost time for your next dose, take only that dose. Do not take double or extra doses. What may interact with this medication? Do not take this medication with any of the following: Arsenic trioxide Certain medications used to regulate abnormal heartbeat or to treat other heart conditions Cisapride Halofantrine Levomethadyl Linezolid MAOIs like Carbex, Eldepryl, Marplan, Nardil, and Parnate Methylene blue Other medications for mental depression Phenothiazines like perphenazine, thioridazine and chlorpromazine Pimozide Procarbazine Sparfloxacin St. John's Wort This medication may also interact with the following: Cimetidine Tolazamide Ziprasidone This list may not describe all possible interactions. Give your health care provider a list of all the medicines, herbs, non-prescription drugs, or dietary supplements you use. Also tell them if you smoke, drink alcohol, or use illegal drugs. Some items may interact with your medicine. What should I watch for while using this medication? Visit your care team for regular checks on your progress. It can take several days before you feel the full effect of this medication. If you have been taking this medication regularly for some time, do not suddenly stop taking it.  You must gradually reduce the dose or you may get severe side effects. Ask your care  team for advice. Even after you stop taking this medication it can still affect your body for several days. Patients and their families should watch out for new or worsening thoughts of suicide or depression. Also watch out for sudden changes in feelings such as feeling anxious, agitated, panicky, irritable, hostile, aggressive, impulsive, severely restless, overly excited and hyperactive, or not being able to sleep. If this happens, especially at the beginning of treatment or after a change in dose, call your care team. You may get drowsy or dizzy. Do not drive, use machinery, or do anything that needs mental alertness until you know how this medication affects you. Do not stand or sit up quickly, especially if you are an older patient. This reduces the risk of dizzy or fainting spells. Alcohol may increase dizziness and drowsiness. Avoid alcoholic drinks. Do not treat yourself for coughs, colds, or allergies without asking your care team for advice. Some ingredients can increase possible side effects. Your mouth may get dry. Chewing sugarless gum or sucking hard candy, and drinking plenty of water may help. Contact your care team if the problem does not go away or is severe. This medication may cause dry eyes and blurred vision. If you wear contact lenses you may feel some discomfort. Lubricating drops may help. See your care team if the problem does not go away or is severe. This medication can make you more sensitive to the sun. Keep out of the sun. If you cannot avoid being in the sun, wear protective clothing and use sunscreen. Do not use sun lamps or tanning beds/booths. What side effects may I notice from receiving this medication? Side effects that you should report to your care team as soon as possible: Allergic reactions--skin rash, itching, hives, swelling of the face, lips, tongue, or throat Irritability, confusion, fast or irregular heartbeat, muscle stiffness, twitching muscles, sweating,  high fever, seizure, chills, vomiting, diarrhea, which may be signs of serotonin syndrome Sudden eye pain or change in vision such as blurry vision, seeing halos around lights, vision loss Thoughts of suicide or self-harm, worsening mood, or feelings of depression Trouble passing urine Side effects that usually do not require medical attention (report to your care team if they continue or are bothersome): Change in sex drive or performance Constipation Dizziness Drowsiness Dry mouth Tremors Weight gain This list may not describe all possible side effects. Call your doctor for medical advice about side effects. You may report side effects to FDA at 1-800-FDA-1088. Where should I keep my medication? Keep out of the reach of children. Store at room temperature between 15 and 30 degrees C (59 and 86 degrees F). Throw away any unused medication after the expiration date. NOTE: This sheet is a summary. It may not cover all possible information. If you have questions about this medicine, talk to your doctor, pharmacist, or health care provider.  2023 Elsevier/Gold Standard (2021-02-04 00:00:00)

## 2022-09-23 ENCOUNTER — Other Ambulatory Visit: Payer: Self-pay | Admitting: Psychiatry

## 2022-09-23 DIAGNOSIS — F4310 Post-traumatic stress disorder, unspecified: Secondary | ICD-10-CM

## 2022-09-23 LAB — URINE DRUGS OF ABUSE SCREEN W ALC, ROUTINE (REF LAB)
Amphetamines, Urine: NEGATIVE ng/mL
Barbiturate, Ur: NEGATIVE ng/mL
Benzodiazepine Quant, Ur: NEGATIVE ng/mL
Cannabinoid Quant, Ur: NEGATIVE ng/mL
Cocaine (Metab.): NEGATIVE ng/mL
Ethanol U, Quan: NEGATIVE %
Methadone Screen, Urine: NEGATIVE ng/mL
Opiate Quant, Ur: NEGATIVE ng/mL
Phencyclidine, Ur: NEGATIVE ng/mL
Propoxyphene, Urine: NEGATIVE ng/mL

## 2022-09-24 ENCOUNTER — Telehealth: Payer: Self-pay | Admitting: Psychiatry

## 2022-09-24 DIAGNOSIS — F419 Anxiety disorder, unspecified: Secondary | ICD-10-CM

## 2022-09-24 MED ORDER — DULOXETINE HCL 60 MG PO CPEP: 60.0000 mg | ORAL_CAPSULE | Freq: Every day | ORAL | 0 refills | Status: DC

## 2022-09-24 MED ORDER — CLONAZEPAM 0.5 MG PO TABS: 0.2500 mg | ORAL_TABLET | ORAL | 0 refills | Status: DC

## 2022-09-24 NOTE — Telephone Encounter (Signed)
Reviewed urine drug screen-09/23/2022.  I have sent clonazepam 0.5 mg-limited supply-it was discussed with patient that clonazepam will only be provided limited supply, short-term use.  If patient continues to have significant anxiety she will need more intensive treatment including more intensive individual psychotherapy.  Will also consider readjusting her other medications including duloxetine or adding another SSRI or SNRI if anxiety not controlled.  Will not recommend use of long-term benzodiazepines in this patient who is already on other CNS depressants like Lyrica.

## 2022-09-26 ENCOUNTER — Encounter (INDEPENDENT_AMBULATORY_CARE_PROVIDER_SITE_OTHER): Payer: Medicare Other | Admitting: Psychiatry

## 2022-09-27 ENCOUNTER — Encounter: Payer: Self-pay | Admitting: *Deleted

## 2022-09-27 NOTE — Telephone Encounter (Signed)
left message that rx was sent and the recommenations

## 2022-09-29 ENCOUNTER — Ambulatory Visit: Payer: PRIVATE HEALTH INSURANCE

## 2022-09-29 DIAGNOSIS — R278 Other lack of coordination: Secondary | ICD-10-CM

## 2022-09-29 DIAGNOSIS — M5459 Other low back pain: Secondary | ICD-10-CM

## 2022-09-29 DIAGNOSIS — M6289 Other specified disorders of muscle: Secondary | ICD-10-CM | POA: Diagnosis not present

## 2022-09-29 DIAGNOSIS — M6281 Muscle weakness (generalized): Secondary | ICD-10-CM
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
## 2022-10-06 ENCOUNTER — Other Ambulatory Visit: Payer: Self-pay | Admitting: Psychiatry

## 2022-10-06 DIAGNOSIS — F4310 Post-traumatic stress disorder, unspecified: Secondary | ICD-10-CM

## 2022-10-07 ENCOUNTER — Ambulatory Visit: Payer: PRIVATE HEALTH INSURANCE | Attending: Obstetrics and Gynecology

## 2022-10-07 DIAGNOSIS — M5459 Other low back pain: Secondary | ICD-10-CM | POA: Diagnosis present

## 2022-10-07 DIAGNOSIS — R278 Other lack of coordination: Secondary | ICD-10-CM | POA: Insufficient documentation

## 2022-10-07 DIAGNOSIS — M6289 Other specified disorders of muscle: Secondary | ICD-10-CM | POA: Diagnosis not present

## 2022-10-07 DIAGNOSIS — M6281 Muscle weakness (generalized): Secondary | ICD-10-CM | POA: Diagnosis present
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
## 2022-10-13 ENCOUNTER — Ambulatory Visit: Payer: Medicare Other | Admitting: Family Medicine

## 2022-10-13 ENCOUNTER — Encounter: Payer: Self-pay | Admitting: Family Medicine

## 2022-10-13 VITALS — BP 114/72 | HR 86 | Temp 98.1°F | Ht 63.5 in | Wt 181.9 lb

## 2022-10-13 DIAGNOSIS — E559 Vitamin D deficiency, unspecified: Secondary | ICD-10-CM

## 2022-10-13 DIAGNOSIS — I10 Essential (primary) hypertension: Secondary | ICD-10-CM

## 2022-10-13 DIAGNOSIS — K219 Gastro-esophageal reflux disease without esophagitis: Secondary | ICD-10-CM | POA: Diagnosis not present

## 2022-10-13 DIAGNOSIS — E782 Mixed hyperlipidemia: Secondary | ICD-10-CM | POA: Diagnosis not present

## 2022-10-13 DIAGNOSIS — E041 Nontoxic single thyroid nodule: Secondary | ICD-10-CM

## 2022-10-13 DIAGNOSIS — Z13820 Encounter for screening for osteoporosis: Secondary | ICD-10-CM

## 2022-10-13 DIAGNOSIS — F331 Major depressive disorder, recurrent, moderate: Secondary | ICD-10-CM

## 2022-10-13 DIAGNOSIS — L905 Scar conditions and fibrosis of skin: Secondary | ICD-10-CM

## 2022-10-13 DIAGNOSIS — L719 Rosacea, unspecified: Secondary | ICD-10-CM

## 2022-10-13 DIAGNOSIS — Z1231 Encounter for screening mammogram for malignant neoplasm of breast: Secondary | ICD-10-CM

## 2022-10-13 DIAGNOSIS — M25541 Pain in joints of right hand: Secondary | ICD-10-CM

## 2022-10-13 LAB — URINALYSIS, ROUTINE W REFLEX MICROSCOPIC
Bilirubin, UA: NEGATIVE
Glucose, UA: NEGATIVE
Ketones, UA: NEGATIVE
Leukocytes,UA: NEGATIVE
Nitrite, UA: NEGATIVE
Protein,UA: NEGATIVE
RBC, UA: NEGATIVE
Specific Gravity, UA: 1.01 (ref 1.005–1.030)
Urobilinogen, Ur: 0.2 mg/dL (ref 0.2–1.0)
pH, UA: 7 (ref 5.0–7.5)

## 2022-10-13 LAB — MICROALBUMIN, URINE WAIVED
Creatinine, Urine Waived: 10 mg/dL (ref 10–300)
Microalb, Ur Waived: 10 mg/L (ref 0–19)
Microalb/Creat Ratio: <30 mg/g (ref <30)

## 2022-10-13 MED ORDER — METAXALONE 800 MG PO TABS: 800.0000 mg | ORAL_TABLET | Freq: Three times a day (TID) | ORAL | 1 refills | Status: DC | PRN

## 2022-10-13 MED ORDER — METRONIDAZOLE 1 % EX GEL: Freq: Every day | CUTANEOUS | 3 refills | Status: DC

## 2022-10-13 MED ORDER — PREGABALIN 150 MG PO CAPS
150.0000 mg | ORAL_CAPSULE | Freq: Every day | ORAL | 1 refills | Status: DC
Start: 2022-10-13 — End: 2022-10-20

## 2022-10-13 NOTE — Patient Instructions (Addendum)
Your mammogram is scheduled for 11/19/22 at 9:20 AM at [facility].   [facility]  Address: [address], [place], [place] [address] Phone: [phone]    [facility] [address] [place], [place] [address] [phone]
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
## 2022-10-14 ENCOUNTER — Telehealth: Payer: Self-pay

## 2022-10-14 NOTE — Telephone Encounter (Signed)
PA initiated via CoverMyMeds for Metaxalone '800MG'$  tablets Key:  [id number] Waiting on determination.

## 2022-10-15 ENCOUNTER — Ambulatory Visit: Admission: RE | Admit: 2022-10-15 | Payer: Medicare Other | Source: Ambulatory Visit

## 2022-10-16 LAB — CBC WITH DIFFERENTIAL/PLATELET
Basophils Absolute: 0.1 10*3/uL (ref 0.0–0.2)
Basos: 1 %
EOS (ABSOLUTE): 0.1 10*3/uL (ref 0.0–0.4)
Eos: 1 %
Hematocrit: 38.8 % (ref 34.0–46.6)
Hemoglobin: 12.7 g/dL (ref 11.1–15.9)
Immature Grans (Abs): 0 10*3/uL (ref 0.0–0.1)
Immature Granulocytes: 0 %
Lymphocytes Absolute: 2.2 10*3/uL (ref 0.7–3.1)
Lymphs: 37 %
MCH: 28.9 pg (ref 26.6–33.0)
MCHC: 32.7 g/dL (ref 31.5–35.7)
MCV: 88 fL (ref 79–97)
Monocytes Absolute: 0.5 10*3/uL (ref 0.1–0.9)
Monocytes: 8 %
Neutrophils Absolute: 3.2 10*3/uL (ref 1.4–7.0)
Neutrophils: 53 %
Platelets: 429 10*3/uL (ref 150–450)
RBC: 4.4 x10E6/uL (ref 3.77–5.28)
RDW: 12.6 % (ref 11.7–15.4)
WBC: 6.1 10*3/uL (ref 3.4–10.8)

## 2022-10-16 LAB — COMPREHENSIVE METABOLIC PANEL
ALT: 23 IU/L (ref 0–32)
AST: 22 IU/L (ref 0–40)
Albumin/Globulin Ratio: 2.1 (ref 1.2–2.2)
Albumin: 4.8 g/dL (ref 3.9–4.9)
Alkaline Phosphatase: 83 IU/L (ref 44–121)
BUN/Creatinine Ratio: 28 (ref 12–28)
BUN: 23 mg/dL (ref 8–27)
Bilirubin Total: 0.3 mg/dL (ref 0.0–1.2)
CO2: 24 mmol/L (ref 20–29)
Calcium: 9.9 mg/dL (ref 8.7–10.3)
Chloride: 100 mmol/L (ref 96–106)
Creatinine, Ser: 0.83 mg/dL (ref 0.57–1.00)
Globulin, Total: 2.3 g/dL (ref 1.5–4.5)
Glucose: 94 mg/dL (ref 70–99)
Potassium: 4.4 mmol/L (ref 3.5–5.2)
Sodium: 142 mmol/L (ref 134–144)
Total Protein: 7.1 g/dL (ref 6.0–8.5)
eGFR: 80 mL/min/{1.73_m2} (ref >59)

## 2022-10-16 LAB — URIC ACID: Uric Acid: 4.7 mg/dL (ref 3.0–7.2)

## 2022-10-16 LAB — ANA+ENA+DNA/DS+ANTICH+CENTR
ANA Titer 1: NEGATIVE
Anti JO-1: <0.2 AI (ref 0.0–0.9)
Centromere Ab Screen: <0.2 AI (ref 0.0–0.9)
Chromatin Ab SerPl-aCnc: <0.2 AI (ref 0.0–0.9)
ENA RNP Ab: <0.2 AI (ref 0.0–0.9)
ENA SM Ab Ser-aCnc: <0.2 AI (ref 0.0–0.9)
ENA SSA (RO) Ab: <0.2 AI (ref 0.0–0.9)
ENA SSB (LA) Ab: <0.2 AI (ref 0.0–0.9)
Scleroderma (Scl-70) (ENA) Antibody, IgG: <0.2 AI (ref 0.0–0.9)
dsDNA Ab: <1 IU/mL (ref 0–9)

## 2022-10-16 LAB — VITAMIN D 25 HYDROXY (VIT D DEFICIENCY, FRACTURES): Vit D, 25-Hydroxy: 28.6 ng/mL — ABNORMAL LOW (ref 30.0–100.0)

## 2022-10-16 LAB — RA QN+CCP(IGG/A)+SJOSSA+SJOSSB
Cyclic Citrullin Peptide Ab: 2 units (ref 0–19)
Rheumatoid fact SerPl-aCnc: 10.4 IU/mL (ref <14.0)

## 2022-10-16 LAB — TSH: TSH: 1.03 u[IU]/mL (ref 0.450–4.500)

## 2022-10-16 LAB — LIPID PANEL W/O CHOL/HDL RATIO
Cholesterol, Total: 284 mg/dL — ABNORMAL HIGH (ref 100–199)
HDL: 84 mg/dL (ref >39)
LDL Chol Calc (NIH): 174 mg/dL — ABNORMAL HIGH (ref 0–99)
Triglycerides: 149 mg/dL (ref 0–149)
VLDL Cholesterol Cal: 26 mg/dL (ref 5–40)

## 2022-10-16 LAB — CK: Total CK: 174 U/L (ref 32–182)

## 2022-10-18 ENCOUNTER — Ambulatory Visit: Payer: PRIVATE HEALTH INSURANCE

## 2022-10-18 ENCOUNTER — Other Ambulatory Visit: Payer: Self-pay | Admitting: Psychiatry

## 2022-10-18 DIAGNOSIS — M5459 Other low back pain: Secondary | ICD-10-CM

## 2022-10-18 DIAGNOSIS — F4310 Post-traumatic stress disorder, unspecified: Secondary | ICD-10-CM

## 2022-10-18 DIAGNOSIS — M6289 Other specified disorders of muscle: Secondary | ICD-10-CM | POA: Diagnosis not present

## 2022-10-18 DIAGNOSIS — M6281 Muscle weakness (generalized): Secondary | ICD-10-CM

## 2022-10-18 DIAGNOSIS — R278 Other lack of coordination: Secondary | ICD-10-CM

## 2022-10-18 DIAGNOSIS — F419 Anxiety disorder, unspecified: Secondary | ICD-10-CM

## 2022-10-18 DIAGNOSIS — G4700 Insomnia, unspecified: Secondary | ICD-10-CM
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
## 2022-10-19 ENCOUNTER — Ambulatory Visit
Admission: RE | Admit: 2022-10-19 | Discharge: 2022-10-19 | Disposition: A | Discharge status: Home / Self Care | Payer: PRIVATE HEALTH INSURANCE | Source: Ambulatory Visit | Attending: Family Medicine | Admitting: Family Medicine

## 2022-10-19 DIAGNOSIS — L905 Scar conditions and fibrosis of skin: Secondary | ICD-10-CM | POA: Diagnosis present

## 2022-10-19 DIAGNOSIS — E041 Nontoxic single thyroid nodule: Secondary | ICD-10-CM | POA: Insufficient documentation

## 2022-10-19 NOTE — Telephone Encounter (Signed)
PA has been denied via CoverMyMeds  Will await for determination via fax.

## 2022-10-20 ENCOUNTER — Ambulatory Visit (INDEPENDENT_AMBULATORY_CARE_PROVIDER_SITE_OTHER): Payer: Medicare Other | Admitting: Family Medicine

## 2022-10-20 ENCOUNTER — Encounter: Payer: Self-pay | Admitting: Family Medicine

## 2022-10-20 VITALS — BP 143/80 | HR 90 | Temp 98.2°F | Wt 181.5 lb

## 2022-10-20 DIAGNOSIS — Z23 Encounter for immunization: Secondary | ICD-10-CM

## 2022-10-20 DIAGNOSIS — M9904 Segmental and somatic dysfunction of sacral region: Secondary | ICD-10-CM

## 2022-10-20 DIAGNOSIS — R3989 Other symptoms and signs involving the genitourinary system: Secondary | ICD-10-CM

## 2022-10-20 DIAGNOSIS — M9908 Segmental and somatic dysfunction of rib cage: Secondary | ICD-10-CM

## 2022-10-20 DIAGNOSIS — M9903 Segmental and somatic dysfunction of lumbar region: Secondary | ICD-10-CM

## 2022-10-20 DIAGNOSIS — G894 Chronic pain syndrome: Secondary | ICD-10-CM | POA: Diagnosis not present

## 2022-10-20 DIAGNOSIS — M62838 Other muscle spasm: Secondary | ICD-10-CM

## 2022-10-20 DIAGNOSIS — M9902 Segmental and somatic dysfunction of thoracic region: Secondary | ICD-10-CM | POA: Diagnosis not present

## 2022-10-20 DIAGNOSIS — G4700 Insomnia, unspecified: Secondary | ICD-10-CM

## 2022-10-20 DIAGNOSIS — M9901 Segmental and somatic dysfunction of cervical region: Secondary | ICD-10-CM | POA: Diagnosis not present

## 2022-10-20 DIAGNOSIS — M9900 Segmental and somatic dysfunction of head region: Secondary | ICD-10-CM

## 2022-10-20 DIAGNOSIS — M9909 Segmental and somatic dysfunction of abdomen and other regions: Secondary | ICD-10-CM

## 2022-10-20 DIAGNOSIS — M9905 Segmental and somatic dysfunction of pelvic region: Secondary | ICD-10-CM

## 2022-10-20 MED ORDER — PREGABALIN 75 MG PO CAPS: ORAL_CAPSULE | ORAL | 0 refills | Status: DC

## 2022-10-20 MED ORDER — HYDROXYZINE HCL 50 MG PO TABS: 25.0000 mg | ORAL_TABLET | Freq: Every evening | ORAL | 4 refills | Status: DC

## 2022-10-20 MED ORDER — METHOCARBAMOL 750 MG PO TABS: 750.0000 mg | ORAL_TABLET | Freq: Every evening | ORAL | 1 refills | Status: DC | PRN
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
## 2022-10-24 ENCOUNTER — Encounter: Payer: Self-pay | Admitting: Family Medicine

## 2022-10-24 DIAGNOSIS — G894 Chronic pain syndrome: Secondary | ICD-10-CM | POA: Insufficient documentation

## 2022-10-24 DIAGNOSIS — L719 Rosacea, unspecified: Secondary | ICD-10-CM | POA: Insufficient documentation

## 2022-10-24 NOTE — Assessment & Plan Note (Signed)
Will start metrogel. Call with any concerns. Continue to monitor.

## 2022-10-24 NOTE — Assessment & Plan Note (Signed)
Rechecking labs today. Await results. Treat as needed.  °

## 2022-10-24 NOTE — Assessment & Plan Note (Signed)
Under good control on current regimen. Continue current regimen. Continue to monitor. Call with any concerns. Refills given. Labs drawn today.   

## 2022-10-24 NOTE — Assessment & Plan Note (Signed)
Continue to follow with psychiatry. Refill of hydroxyzine given today. Call with any concerns.

## 2022-10-24 NOTE — Assessment & Plan Note (Signed)
Not doing well. Following with psychiatry. Call with any concerns. Discussed trying her doxepin. Continue to monitor.

## 2022-10-24 NOTE — Assessment & Plan Note (Signed)
Doing well off medicine. Continue to monitor. Call with any concerns. Labs drawn today.

## 2022-10-24 NOTE — Assessment & Plan Note (Signed)
Would like to come off her lyrica. Instructions for tapering off given today along with refill for lower dose. Methocarbamol sent in for muscle relaxer. She does have somatic dysfunction that is contributing to her symptoms. Treated today with good results as below. Call with any concerns.

## 2022-10-27 ENCOUNTER — Ambulatory Visit: Payer: PRIVATE HEALTH INSURANCE

## 2022-11-03 ENCOUNTER — Ambulatory Visit: Payer: PRIVATE HEALTH INSURANCE

## 2022-11-10 ENCOUNTER — Ambulatory Visit: Payer: PRIVATE HEALTH INSURANCE | Attending: Obstetrics and Gynecology

## 2022-11-10 DIAGNOSIS — R278 Other lack of coordination: Secondary | ICD-10-CM | POA: Insufficient documentation

## 2022-11-10 DIAGNOSIS — M6289 Other specified disorders of muscle: Secondary | ICD-10-CM | POA: Insufficient documentation

## 2022-11-10 DIAGNOSIS — M6281 Muscle weakness (generalized): Secondary | ICD-10-CM | POA: Diagnosis present

## 2022-11-10 DIAGNOSIS — M5459 Other low back pain: Secondary | ICD-10-CM | POA: Insufficient documentation
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
## 2022-11-11 ENCOUNTER — Ambulatory Visit: Payer: Medicare Other | Admitting: Family Medicine

## 2022-11-17 ENCOUNTER — Ambulatory Visit: Payer: PRIVATE HEALTH INSURANCE

## 2022-11-19 ENCOUNTER — Ambulatory Visit
Admission: RE | Admit: 2022-11-19 | Discharge: 2022-11-19 | Disposition: A | Discharge status: Home / Self Care | Payer: PRIVATE HEALTH INSURANCE | Source: Ambulatory Visit | Attending: Family Medicine | Admitting: Family Medicine

## 2022-11-19 DIAGNOSIS — Z1231 Encounter for screening mammogram for malignant neoplasm of breast: Secondary | ICD-10-CM | POA: Insufficient documentation

## 2022-11-21 ENCOUNTER — Encounter: Payer: Self-pay | Admitting: Family Medicine

## 2022-11-21 DIAGNOSIS — D170 Benign lipomatous neoplasm of skin and subcutaneous tissue of head, face and neck: Secondary | ICD-10-CM

## 2022-11-22 ENCOUNTER — Ambulatory Visit: Payer: Self-pay | Admitting: *Deleted

## 2022-11-22 NOTE — Telephone Encounter (Signed)
Pt calling back, is wanting to see if appt can be for tomorrow d/t unable to come today. No appts available and P[facility] NT unable to schedule for procedure. Advised will route to practice and FU with pt.

## 2022-11-22 NOTE — Telephone Encounter (Signed)
Reason for Disposition  [1] MODERATE back pain (e.g., interferes with normal activities) AND [2] present > 3 days  Answer Assessment - Initial Assessment Questions 1. ONSET: "When did the pain begin?"      Prior back surgery- lipoma removed- keeping her awake/limited movement 2. LOCATION: "Where does it hurt?" (upper, mid or lower back)     Surgical site 3. SEVERITY: "How bad is the pain?"  (e.g., Scale 1-10; mild, moderate, or severe)   - MILD (1-3): Doesn't interfere with normal activities.    - MODERATE (4-7): Interferes with normal activities or awakens from sleep.    - SEVERE (8-10): Excruciating pain, unable to do any normal activities.      moderate 4. PATTERN: "Is the pain constant?" (e.g., yes, no; constant, intermittent)      constant 5. RADIATION: "Does the pain shoot into your legs or somewhere else?"     Slight radiation into back of thigh 6. CAUSE:  "What do you think is causing the back pain?"      Not sure- fels like nerve pain 7. BACK OVERUSE:  "Any recent lifting of heavy objects, strenuous work or exercise?"     no 8. MEDICINES: "What have you taken so far for the pain?" (e.g., nothing, acetaminophen, NSAIDS)     Naproxen, Pregablin- 300 mg at night, Cymbalta '60mg'$  9. NEUROLOGIC SYMPTOMS: "Do you have any weakness, numbness, or problems with bowel/bladder control?"      no 10. OTHER SYMPTOMS: "Do you have any other symptoms?" (e.g., fever, abdomen pain, burning with urination, blood in urine)       no  Protocols used: Back Pain-A-AH

## 2022-11-22 NOTE — Telephone Encounter (Signed)
  Chief Complaint: back pain- wants injection today is possible Symptoms: back pain- chronic Frequency: prior back surgery- patient states she is having trigger pain and she believes it is time for injection- she has appointment Thursday- but needs it moved up Pertinent Negatives: Patient denies   Disposition: '[]'$ ED /'[]'$ Urgent Care (no appt availability in office) / '[]'$ Appointment(In office/virtual)/ '[]'$  [facility]/ '[]'$ Home Care/ '[]'$ Refused Recommended Disposition /'[]'$ [facility]/ '[x]'$  Follow-up with PCP Additional Notes: Patient is requesting appointment today or tomorrow afternoon - no open appointment - call sent to office

## 2022-11-22 NOTE — Telephone Encounter (Signed)
Called patient to let her know that Dr. Wynetta Emery is unable to see her earlier and she does not give back injections. Patient verbalized understanding

## 2022-11-23 ENCOUNTER — Ambulatory Visit: Payer: Medicare Other | Admitting: Family Medicine

## 2022-11-24 ENCOUNTER — Ambulatory Visit
Admission: RE | Admit: 2022-11-24 | Discharge: 2022-11-24 | Disposition: A | Discharge status: Home / Self Care | Payer: Self-pay | Source: Ambulatory Visit | Attending: *Deleted | Admitting: *Deleted

## 2022-11-24 ENCOUNTER — Ambulatory Visit (INDEPENDENT_AMBULATORY_CARE_PROVIDER_SITE_OTHER): Payer: Medicare Other | Admitting: Psychiatry

## 2022-11-24 ENCOUNTER — Other Ambulatory Visit: Payer: Self-pay | Admitting: *Deleted

## 2022-11-24 ENCOUNTER — Ambulatory Visit: Payer: PRIVATE HEALTH INSURANCE

## 2022-11-24 ENCOUNTER — Encounter: Payer: Self-pay | Admitting: Psychiatry

## 2022-11-24 ENCOUNTER — Other Ambulatory Visit: Payer: Self-pay | Admitting: Psychiatry

## 2022-11-24 VITALS — BP 170/89 | HR 83 | Temp 97.5°F | Ht 63.5 in | Wt 188.8 lb

## 2022-11-24 DIAGNOSIS — F419 Anxiety disorder, unspecified: Secondary | ICD-10-CM

## 2022-11-24 DIAGNOSIS — F418 Other specified anxiety disorders: Secondary | ICD-10-CM | POA: Diagnosis not present

## 2022-11-24 DIAGNOSIS — M6289 Other specified disorders of muscle: Secondary | ICD-10-CM | POA: Diagnosis not present

## 2022-11-24 DIAGNOSIS — Z1231 Encounter for screening mammogram for malignant neoplasm of breast: Secondary | ICD-10-CM

## 2022-11-24 DIAGNOSIS — Z630 Problems in relationship with spouse or partner: Secondary | ICD-10-CM | POA: Diagnosis not present

## 2022-11-24 DIAGNOSIS — F321 Major depressive disorder, single episode, moderate: Secondary | ICD-10-CM

## 2022-11-24 DIAGNOSIS — M5459 Other low back pain: Secondary | ICD-10-CM

## 2022-11-24 DIAGNOSIS — M6281 Muscle weakness (generalized): Secondary | ICD-10-CM

## 2022-11-24 DIAGNOSIS — F4310 Post-traumatic stress disorder, unspecified: Secondary | ICD-10-CM

## 2022-11-24 DIAGNOSIS — G4700 Insomnia, unspecified: Secondary | ICD-10-CM

## 2022-11-24 DIAGNOSIS — R278 Other lack of coordination: Secondary | ICD-10-CM

## 2022-11-24 DIAGNOSIS — G4701 Insomnia due to medical condition: Secondary | ICD-10-CM

## 2022-11-24 DIAGNOSIS — Z9189 Other specified personal risk factors, not elsewhere classified: Secondary | ICD-10-CM

## 2022-11-24 MED ORDER — DULOXETINE HCL 60 MG PO CPEP: 60.0000 mg | ORAL_CAPSULE | Freq: Every day | ORAL | 0 refills | Status: DC

## 2022-11-24 MED ORDER — CLONAZEPAM 0.5 MG PO TABS: 0.2500 mg | ORAL_TABLET | ORAL | 0 refills | Status: DC

## 2022-11-24 NOTE — Therapy (Signed)
[facility] [place], [place], [address] Phone: [phone]   Fax:  [phone]  November 24, 2022   No Recipients  Physical Therapy Discharge Summary  Patient: Kaitlyn Frazier  MRN: [id number]  Date of Birth: 08/07/1961   REFERRING PROVIDER: Jaquita Folds, MD   REFERRING DIAG:  N81.10 (ICD-10-CM) - Prolapse of anterior vaginal wall  N81.6 (ICD-10-CM) Prolapse of posterior vaginal wall  M62.838 (ICD-10-CM) - Levator spasm  N32.81 (ICD-10-CM) - Overactive bladder  N39.3 (ICD-10-CM) - SUI (stress urinary incontinence, female)   THERAPY DIAG:  Pelvic floor dysfunction  Other lack of coordination  Muscle weakness (generalized)  Other low back pain The above patient had been seen in Physical Therapy 5 times of 10 treatments scheduled with 1 no shows and 2 cancellations.  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 Pt noticed in March a constant urethral pain and had multiple UTIs exams and all were negative. Pt given estrogen cream but does not help completely. Pt feels uncomfortable even here in sitting. Pt also has pain with manual stimulation with intimacy as well. The pain/burning will continue a few days after intimacy and OTCs medication does not help. U/s results were also normal. Pt has a hx of lower back pain (current and constant) and is cautious on movement because she does not want to hurt her back.   Last session: 11/10/22 - Pt continues to have burning sensation vaginally. Pt feels like the "burning" sensation is better when not engaged in sexual activity. Pt is currently having burning sensation in sitting (7/10).   Discharge Findings: Pt requests  self-discharge  Functional Status at Discharge: Did not assess   No Goals Met  SHORT TERM GOALS: Target date: 10/28/2022  Patient will demonstrate independent and coordinated diaphragmatic breathing in supine with a 1:2 breathing pattern for improved down-regulation of the nervous system and improved management of intra-abdominal pressures in order to increase function at home and in the community. Baseline: will continue to assess  Goal status: INITIAL    LONG TERM GOALS: Target date: 12/02/2022   Patient will demonstrate circumferential and sequential contraction of >3/5 MMT, > 5 sec hold x5 and 5 consecutive quick flicks with </= 10 min rest between testing bouts, and relaxation of the PFM coordinated with breath for improved management of intra-abdominal pressure and normal bowel and bladder function without the presence of pain nor incontinence in order to improve participation at home and in the community. Baseline: 3/5 MMT with gluteal activation  Goal status: INITIAL  2.  Patient will demonstrate coordinated lengthening and relaxation of PFM with diaphragmatic inhalation in order to decrease spasm and allow for unrestricted elimination of urine/feces for improved overall QOL. Baseline: 50% of Type 1 BMs per week and stop/go urine stream  Goal status: INITIAL  3.  Patient will report less than 5 incidents of stress urinary incontinence over the course of 3 weeks while coughing/sneezing/walking to the bathroom/lifting/intimacy in order to demonstrate improved PFM coordination, strength, and function for improved overall QOL. Baseline: has urinary leakage with all the above but not every time Goal status: INITIAL  4. Patient will decrease worst pain as reported on NPRS by at least 2 points to demonstrate clinically significant reduction in pain in  order to restore/improve function and overall QOL. Baseline: 8/10 for both perineal pain/discomfort and LBP  Goal status: INITIAL  5.   Patient will report being able to return to activities including, but not limited to: lifting, physical activity, traveling, volunteering, and intimacy without pain or limitation to indicate complete resolution of the chief concerns and return to prior level of participation at home and in the community. Baseline: Pt is hesitant with movement/cautious due to LBP and limited to traveling/squatting/lifting due to perineal discomfort  Goal status: INITIAL  6.  Patient will score  >/= 60, 54, 66 on FOTO Urinary Problem, Bowel Constipation, and Bowel Leakage  in order to demonstrate decreased pain, improved PFM coordination, improved IAP management, and overall QOL.  Baseline: 58, 45, 60  Goal status: INITIAL    Sincerely,   Filbert Berthold, PT, DPT   CC No Recipients  [facility] [address] [place], [place], [address] Phone: [phone]   Fax:  [phone]  Patient: Kaitlyn Frazier  MRN: [id number]  Date of Birth: Aug 01, 1961

## 2022-11-24 NOTE — Progress Notes (Unsigned)
[facility] OP Progress Note  11/24/2022 4:06 PM Kaitlyn Frazier  MRN:  [id number]  Chief Complaint:  Chief Complaint  Patient presents with   Follow-up   Anxiety   Depression   HPI: Kaitlyn Frazier is a 61 year old Caucasian female, currently married, on SSD, employed part-time, lives in [place], has a history of PTSD, partner relationship troubles, functional neurological symptoms disorder, fibromyalgia, insomnia, degenerative disc disease, nonalcoholic fatty liver, essential hypertension, obstructive sleep apnea was evaluated in office today.  Patient today reports she has been having depressive symptoms, low motivation, low energy, fatigue, anhedonia, sadness, increased appetite, sleep problems since the past several weeks.  She also reports her anxiety is also at an all time high since the past several weeks.  Patient reports she was recently diagnosed with obstructive sleep apnea and has upcoming appointment with ENT for a mouthpiece.  She looks forward to that.  That likely affecting her sleep and also contributing to some of the symptoms of fatigue and low energy during the day.Had side effects to doxepin, did not tolerate it.  Patient continues to have relationship struggles with her spouse however currently reports he has been trying to make changes and has not been physically abusive.  Patient reports they are starting family therapy today and she hence has to leave as soon as possible to keep the appointment.  Her spouse also presented to the clinic today with patient although he waited outside in the lobby and patient was seen alone for the session.  Patient reports she looks forward to the holiday break since her spouse has completed his training and she is hoping he will be home more and will be able to spend more time together.  Patient continues to be in a lot of pain, currently under the care of Dr. Wynetta Emery.  Reports she currently takes pregabalin at night.  She reports that  does help.  Patient reports she reduced the dosage of Cymbalta back to 30 mg after her last visit and that is likely making her pain worse.  Interested in going back to the 60 mg.  Patient currently denies any suicidality, homicidality or perceptual disturbances.  Patient today in session with elevated blood pressure reading, blood pressure was repeated continued to be high.  Patient needs to follow up with primary care provider.  Unknown if pain contributing to her elevated blood pressure.  Visit Diagnosis:    ICD-10-CM   1. PTSD (post-traumatic stress disorder)  F43.10 DULoxetine (CYMBALTA) 60 MG capsule    clonazePAM (KLONOPIN) 0.5 MG tablet    2. Current moderate episode of major depressive disorder without prior episode ([place])  F32.1     3. Other specified anxiety disorders  F41.8    generalized anxiety not occurring more days than not    4. Partner relational problem  Z63.0     5. Insomnia due to medical condition  G47.01    OSA    6. At risk for prolonged QT interval syndrome  Z91.89 EKG 12-Lead      Past Psychiatric History: Reviewed family psychiatric history from progress note on 08/04/2022.  Past trials of medications-multiple-does not remember a lot of names-however has tried Prozac, Zoloft, Latuda, Valium, seroquel.  Past Medical History:  Past Medical History:  Diagnosis Date   Abnormal vaginal Pap smear    Depression    Fibromyalgia    hx of Conversion disorder    hx of Peritonitis (HCC)    Mitral valve regurgitation    Thyroid nodule  Past Surgical History:  Procedure Laterality Date   ABDOMINAL HYSTERECTOMY     ANTERIOR / POSTERIOR COMBINED FUSION CERVICAL SPINE     ANTERIOR LAT LUMBAR FUSION     BREAST BIOPSY N/A    pt unsure which breast 10 years ago-fibrous tissue   CESAREAN SECTION     INGUINAL HERNIA REPAIR     NOSE SURGERY     TUMOR REMOVAL     on her SI joint    Family Psychiatric History: Reviewed family psychiatric history from progress  note on 08/04/2022.  Family History:  Family History  Problem Relation Age of Onset   Alcohol abuse Mother    Bipolar disorder Mother    Uterine cancer Paternal Aunt        89s   Ovarian cancer Paternal Aunt        51s   Ovarian cancer Paternal Aunt        66s   Ovarian cancer Cousin        75s   Tourette syndrome Son     Social History: Reviewed social history from progress note on 08/04/2022. Social History   Socioeconomic History   Marital status: Married    Spouse name: Girard Cooter   Number of children: 1   Years of education: Not on file   Highest education level: Master's degree (e.g., MA, MS, MEng, MEd, MSW, MBA)  Occupational History   Not on file  Tobacco Use   Smoking status: Never    Passive exposure: Never   Smokeless tobacco: Never  Vaping Use   Vaping Use: Some days   Substances: Nicotine  Substance and Sexual Activity   Alcohol use: Never   Drug use: Never   Sexual activity: Yes  Other Topics Concern   Not on file  Social History Narrative   Not on file   Social Determinants of Health   Financial Resource Strain: Not on file  Food Insecurity: Not on file  Transportation Needs: Not on file  Physical Activity: Not on file  Stress: Not on file  Social Connections: Not on file    Allergies:  Allergies  Allergen Reactions   Gabapentin Other (See Comments), Hives and Rash    Severe depression per pt Other reaction(s): confusion, Other Severe depression per pt    Amitriptyline Other (See Comments)    hallucinations   Baclofen Other (See Comments)    Severe depression per pt   Cyclobenzaprine Other (See Comments)   Doxepin    Latex Hives   Norflex Tablets [Orphenadrine]    Pollen Extract Itching   Tizanidine Other (See Comments)   Other Rash    Metabolic Disorder Labs: Lab Results  Component Value Date   HGBA1C 6.0 (H) 04/16/2022   MPG 125.5 04/16/2022   No results found for: "PROLACTIN" Lab Results  Component Value Date   CHOL  284 (H) 10/13/2022   TRIG 149 10/13/2022   HDL 84 10/13/2022   CHOLHDL 3.7 04/16/2022   VLDL 23 04/16/2022   LDLCALC 174 (H) 10/13/2022   LDLCALC 168 (H) 04/16/2022   Lab Results  Component Value Date   TSH 1.030 10/13/2022   TSH 0.859 04/16/2022    Therapeutic Level Labs: No results found for: "LITHIUM" No results found for: "VALPROATE" No results found for: "CBMZ"  Current Medications: Current Outpatient Medications  Medication Sig Dispense Refill   albuterol (VENTOLIN HFA) 108 (90 Base) MCG/ACT inhaler Inhale into the lungs every 6 (six) hours as needed for wheezing or shortness  of breath.     buPROPion (WELLBUTRIN SR) 150 MG 12 hr tablet Take 150 mg by mouth daily.     Cholecalciferol (VITAMIN D-3) 25 MCG (1000 UT) CAPS Take by mouth.     fluticasone (FLONASE) 50 MCG/ACT nasal spray Place 1 spray into both nostrils daily.     hydrOXYzine (ATARAX) 50 MG tablet Take 0.5-1 tablets (25-50 mg total) by mouth at bedtime. 60 tablet 4   metroNIDAZOLE (METROGEL) 1 % gel Apply topically daily. 45 g 3   omeprazole (PRILOSEC) 40 MG capsule Take 40 mg by mouth daily.     prednisoLONE acetate (PRED FORTE) 1 % ophthalmic suspension SMARTSIG:In Eye(s)     pregabalin (LYRICA) 75 MG capsule Take 1 cap in the PM for 1-2 weeks, then stop 14 capsule 0   trimethoprim-polymyxin b (POLYTRIM) ophthalmic solution Place 1 drop into the left eye 4 (four) times daily.     cetirizine (ZYRTEC) 10 MG tablet Take 10 mg by mouth daily. (Patient not taking: Reported on 11/24/2022)     clonazePAM (KLONOPIN) 0.5 MG tablet Take 0.5-1 tablets (0.25-0.5 mg total) by mouth as directed. Take half to one tablet once a day as needed for severe panic attacks only, limit use , short term supply only 4 tablet 0   DULoxetine (CYMBALTA) 60 MG capsule Take 1 capsule (60 mg total) by mouth daily. 90 capsule 0   No current facility-administered medications for this visit.     Musculoskeletal: Strength & Muscle Tone: within  normal limits Gait & Station: normal Patient leans: N/A  Psychiatric Specialty Exam: Review of Systems  Constitutional:  Positive for fatigue.  Musculoskeletal:  Positive for arthralgias, back pain and myalgias.  Psychiatric/Behavioral:  Positive for decreased concentration, dysphoric mood and sleep disturbance. The patient is nervous/anxious.   All other systems reviewed and are negative.   Blood pressure (!) 170/89, pulse 83, temperature (!) 97.5 F (36.4 C), temperature source Oral, height 5' 3.5" (1.613 m), weight 188 lb 12.8 oz (85.6 kg).Body mass index is 32.92 kg/m.  General Appearance: Casual  Eye Contact:  Fair  Speech:  Normal Rate  Volume:  Normal  Mood:  Anxious and Depressed  Affect:  Congruent  Thought Process:  Goal Directed and Descriptions of Associations: Intact  Orientation:  Full (Time, Place, and Person)  Thought Content: Logical   Suicidal Thoughts:  No  Homicidal Thoughts:  No  Memory:  Immediate;   Fair Recent;   Fair Remote;   Fair  Judgement:  Good  Insight:  Fair  Psychomotor Activity:  Normal  Concentration:  Concentration: Good and Attention Span: Fair  Recall:  [employer] of Knowledge: Fair  Language: Fair  Akathisia:  No  Handed:  Right  AIMS (if indicated): not done  Assets:  Communication Skills Desire for Improvement Housing Social Support  ADL's:  Intact  Cognition: WNL  Sleep:  Poor   Screenings: GAD-7    Flowsheet Row Office Visit from 10/13/2022 in [facility] Visit from 09/22/2022 in [facility] Office Visit from 08/04/2022 in [facility] Visit from 05/24/2022 in [facility]  Total GAD-7 Score '9 6 6 5      '$ PHQ2-9    [place] Visit from 11/24/2022 in [facility] Office Visit from 10/13/2022 in [facility] Visit from 09/22/2022 in [facility] Visit from 08/04/2022 in [facility] Visit from 05/24/2022 in  [facility] [facility]  PHQ-2 Total Score '5 3 3 2 2  '$ PHQ-9 Total Score '16 13 13 9 12      '$ Flowsheet Row Office Visit from 11/24/2022 in [facility] Office Visit from 09/22/2022 in [facility] from 08/05/2022 in [place] CATEGORY No Risk No Risk No Risk        Assessment and Plan: Kaitlyn Frazier is a 61 year old Caucasian female, currently married, on SSD, part-time employed, lives in [place], has a history of multiple mental health diagnoses including MDD, PTSD, functional neurological symptoms disorder, fibromyalgia, degenerative disc disease, multiple other medical problems including recent diagnosis of sleep apnea was evaluated in office today.  Patient with current worsening depression symptoms, sleep problems, untreated sleep apnea which also likely contributing to multiple symptoms of fatigue and low energy, will benefit from medication readjustment, psychotherapy sessions.  Plan PTSD-unstable Continue Wellbutrin SR 150 mg p.o. daily for now.  However will reduce the Wellbutrin to 75 mg p.o. daily after getting an EKG completed, plan to start Rexulti if EKG is within normal limits. Discontinue doxepin for noncompliance and side effects Continues Cymbalta, advised to continue 60 mg p.o. daily, this is the dosage that she was prescribed last visit. Continue CBT with her therapist will need to coordinate care.  MDD-unstable Plan to start [place] after reviewing EKG.  Patient advised to get EKG completed by calling [phone]. Continue Cymbalta as prescribed- dose readjusted . Plan to taper off Wellbutrin.  Partner relationship problem-improving Patient as well as spouse to start family therapy today.  Other specified anxiety , generalized anxiety not occurring  more days than not-unstable Low-dose clonazepam 0.25 to 0.5 mg as needed, limited supply, will continue to taper off. Hydroxyzine 25 mg as needed for severe anxiety attacks Reviewed [place] PMP AWARxE  Insomnia-unstable Patient with untreated sleep apnea, has upcoming appointment with ENT for mouthpiece. She will need management of her sleep apnea. Continue sleep hygiene techniques Discontinue doxepin due to side effects  At risk for prolonged QT syndrome-we will order EKG.  Patient to call [phone].  Patient with elevated blood pressure reading in session, will need to follow-up with her primary care provider. This note was generated in part or whole with voice recognition software. Voice recognition is usually quite accurate but there are transcription errors that can and very often do occur. I apologize for any typographical errors that were not detected and corrected.   Addendum - patient rushed out to attend family therapy without scheduling follow up appointment. Communicated with staff to contact patient to schedule .  Ursula Alert, MD 11/25/2022, 9:28 AM

## 2022-11-24 NOTE — Patient Instructions (Signed)
Brexpiprazole Tablets What is this medication? BREXPIPRAZOLE (brex PIP ray zole) treats schizophrenia. It may also be used with antidepressant medication to treat depression. It can also be used to treat agitation caused by Alzheimer disease. It works by balancing the levels of dopamine and serotonin in your brain, substances that help regulate mood, behaviors, and thoughts. It belongs to a group of medications called antipsychotics. Antipsychotic medications can be used to treat several kinds of mental health conditions. This medicine may be used for other purposes; ask your health care provider or pharmacist if you have questions. COMMON BRAND NAME(S): REXULTI What should I tell my care team before I take this medication? They need to know if you have any of these conditions: Dementia Diabetes Difficulty swallowing Have trouble controlling your muscles Have urges you are unable to control (for example, gambling, spending money, or eating) Heart disease High cholesterol History of breast cancer History of stroke Kidney disease Liver disease Low blood counts, like low white cell, platelet, or red cell counts Low blood pressure Parkinson's disease Seizures Suicidal thoughts, plans or attempt; a previous suicide attempt by you or a family member An unusual or allergic reaction to brexpiprazole, other medications, foods, dyes, or preservatives Pregnant or trying to get pregnant Breast-feeding How should I use this medication? Take this medication by mouth with water. Take it as directed on the prescription label at the same time every day. You can take it with or without food. If it upsets your stomach, take it with food. Keep taking this medication unless your care team tells you to stop. Stopping it too quickly can cause serious side effects. It can also make your condition worse. A special MedGuide will be given to you by the pharmacist with each prescription and refill. Be sure to read  this information carefully each time. Talk to your care team about the use of this medication in children. While it may be prescribed for children as young as 13 years for selected conditions, precautions do apply. Overdosage: If you think you have taken too much of this medicine contact a poison control center or emergency room at once. NOTE: This medicine is only for you. Do not share this medicine with others. What if I miss a dose? If you miss a dose, take it as soon as you can. If it is almost time for your next dose, take only that dose. Do not take double or extra doses. What may interact with this medication? Do not take this medication with any of the following: Aripiprazole Metoclopramide This medication may also interact with the following: Antihistamines for allergy, cough, and cold Certain medications for anxiety or sleep Certain medications for depression like amitriptyline, duloxetine, fluoxetine, paroxetine, sertraline Certain medications for fungal infections like fluconazole, itraconazole, ketoconazole Clarithromycin General anesthetics like halothane, isoflurane, methoxyflurane, propofol Levodopa or other medications for Parkinson's disease Medications for blood pressure Medications that relax muscles for surgery Medications for seizures Narcotic medications for pain Phenothiazines like chlorpromazine, prochlorperazine, thioridazine Quinidine Rifampin St. John's Wort This list may not describe all possible interactions. Give your health care provider a list of all the medicines, herbs, non-prescription drugs, or dietary supplements you use. Also tell them if you smoke, drink alcohol, or use illegal drugs. Some items may interact with your medicine. What should I watch for while using this medication? Visit your care team for regular checks on your progress. Tell your care team if symptoms do not start to get better or if they get worse.  Do not stop taking except on your  care team's advice. You may develop a severe reaction. Your care team will tell you how much medication to take. Patients and their families should watch out for new or worsening depression or thoughts of suicide. Also watch out for sudden changes in feelings such as feeling anxious, agitated, panicky, irritable, hostile, aggressive, impulsive, severely restless, overly excited and hyperactive, or not being able to sleep. If this happens, especially at the beginning of antidepressant treatment or after a change in dose, call your care team. You may get dizzy or drowsy. Do not drive, use machinery, or do anything that needs mental alertness until you know how this medication affects you. Do not stand or sit up quickly, especially if you are an older patient. This reduces the risk of dizzy or fainting spells. Alcohol may interfere with the effect of this medication. Avoid alcoholic drinks. There have been reports of increased sexual urges or other strong urges such as gambling while taking this medication. If you experience any of these while taking this medication, you should report this to your care team as soon as possible. This medication may cause dry eyes and blurred vision. If you wear contact lenses you may feel some discomfort. Lubricating drops may help. See your eye care team if the problem does not go away or is severe. This medication may increase blood sugar. Ask your care team if changes in diet or medications are needed if you have diabetes. This medication can cause problems with controlling your body temperature. It can lower the response of your body to cold temperatures. If possible, stay indoors during cold weather. If you must go outdoors, wear warm clothes. It can also lower the response of your body to heat. Do not overheat. Do not over-exercise. Stay out of the sun when possible. If you must be in the sun, wear cool clothing. Drink plenty of water. If you have trouble controlling your  body temperature, call your care team right away. What side effects may I notice from receiving this medication? Side effects that you should report to your care team as soon as possible: Allergic reactions--skin rash, itching, hives, swelling of the face, lips, tongue, or throat High blood sugar (hyperglycemia)--increased thirst or amount of urine, unusual weakness or fatigue, blurry vision High fever, stiff muscles, increased sweating, fast or irregular heartbeat, and confusion, which may be signs of neuroleptic malignant syndrome Infection--fever, chills, cough, sore throat Low blood pressure--dizziness, feeling faint or lightheaded, blurry vision Pain or trouble swallowing Seizures Stroke--sudden numbness or weakness of the face, arm, or leg, trouble speaking, confusion, trouble walking, loss of balance or coordination, dizziness, severe headache, change in vision Thoughts of suicide or self-harm, worsening mood, feelings of depression Uncontrolled and repetitive body movements, muscle stiffness or spasms, tremors or shaking, loss of balance or coordination, restlessness, shuffling walk, which may be signs of extrapyramidal symptoms (EPS) Urges to engage in impulsive behaviors such as gambling, binge eating, sexual activity, or shopping in ways that are unusual for you Side effects that usually do not require medical attention (report to your care team if they continue or are bothersome): Constipation Drowsiness Headache Weight gain This list may not describe all possible side effects. Call your doctor for medical advice about side effects. You may report side effects to FDA at 1-800-FDA-1088. Where should I keep my medication? Keep out of the reach of children and pets. Store at room temperature between 20 and 25 degrees C (68  and 77 degrees F). Get rid of any unused medication after the expiration date. To get rid of medications that are no longer needed or have expired: Take the  medication to a medication take-back program. Check with your pharmacy or law enforcement to find a location. If you cannot return the medication, check the label or package insert to see if the medication should be thrown out in the garbage or flushed down the toilet. If you are not sure, ask your care team. If it is safe to put it in the trash, take the medication out of the container. Mix the medication with cat litter, dirt, coffee grounds, or other unwanted substance. Seal the mixture in a bag or container. Put it in the trash. NOTE: This sheet is a summary. It may not cover all possible information. If you have questions about this medicine, talk to your doctor, pharmacist, or health care provider.  2023 Elsevier/Gold Standard (2014-06-28 00:00:00)

## 2022-11-25 ENCOUNTER — Other Ambulatory Visit: Payer: Self-pay | Admitting: Family Medicine

## 2022-11-25 ENCOUNTER — Encounter: Payer: Self-pay | Admitting: Family Medicine

## 2022-11-25 ENCOUNTER — Ambulatory Visit (INDEPENDENT_AMBULATORY_CARE_PROVIDER_SITE_OTHER): Payer: Medicare Other | Admitting: Family Medicine

## 2022-11-25 VITALS — BP 155/85 | HR 80 | Temp 98.0°F | Ht 63.5 in | Wt 191.4 lb

## 2022-11-25 DIAGNOSIS — M9908 Segmental and somatic dysfunction of rib cage: Secondary | ICD-10-CM | POA: Diagnosis not present

## 2022-11-25 DIAGNOSIS — M9904 Segmental and somatic dysfunction of sacral region: Secondary | ICD-10-CM

## 2022-11-25 DIAGNOSIS — M9909 Segmental and somatic dysfunction of abdomen and other regions: Secondary | ICD-10-CM

## 2022-11-25 DIAGNOSIS — G5701 Lesion of sciatic nerve, right lower limb: Secondary | ICD-10-CM

## 2022-11-25 DIAGNOSIS — M9900 Segmental and somatic dysfunction of head region: Secondary | ICD-10-CM

## 2022-11-25 DIAGNOSIS — G4733 Obstructive sleep apnea (adult) (pediatric): Secondary | ICD-10-CM

## 2022-11-25 DIAGNOSIS — M9901 Segmental and somatic dysfunction of cervical region: Secondary | ICD-10-CM | POA: Diagnosis not present

## 2022-11-25 DIAGNOSIS — I10 Essential (primary) hypertension: Secondary | ICD-10-CM

## 2022-11-25 DIAGNOSIS — G894 Chronic pain syndrome: Secondary | ICD-10-CM

## 2022-11-25 DIAGNOSIS — M9905 Segmental and somatic dysfunction of pelvic region: Secondary | ICD-10-CM

## 2022-11-25 DIAGNOSIS — M9903 Segmental and somatic dysfunction of lumbar region: Secondary | ICD-10-CM

## 2022-11-25 DIAGNOSIS — M9902 Segmental and somatic dysfunction of thoracic region: Secondary | ICD-10-CM

## 2022-11-25 MED ORDER — METHOCARBAMOL 500 MG PO TABS: 500.0000 mg | ORAL_TABLET | Freq: Three times a day (TID) | ORAL | 1 refills | Status: DC

## 2022-11-25 MED ORDER — HYDROCHLOROTHIAZIDE 25 MG PO TABS: 25.0000 mg | ORAL_TABLET | Freq: Every day | ORAL | 3 refills | Status: DC

## 2022-11-25 MED ORDER — PREGABALIN 150 MG PO CAPS: 150.0000 mg | ORAL_CAPSULE | Freq: Every day | ORAL | 3 refills | Status: DC

## 2022-11-25 MED ORDER — HYDROCODONE-ACETAMINOPHEN 5-325 MG PO TABS: 1.0000 | ORAL_TABLET | Freq: Three times a day (TID) | ORAL | 0 refills | Status: AC | PRN

## 2022-11-25 NOTE — Assessment & Plan Note (Signed)
Working with sleep medicine. Await CPAP. Name of TMJ specialist given today.

## 2022-11-25 NOTE — Patient Instructions (Addendum)
Please call to schedule your bone density: [facility] [place]: [address], [place], [place] [address] Phone: [phone]   [place], [place] [address] [phone]) [email or web address]

## 2022-11-25 NOTE — Assessment & Plan Note (Addendum)
In exacerbation. Did not do well with cutting down on lyrica and has been having spasms off muscle relaxer. Will restart methocarbamol daily to see if that helps and go back up to the '150mg'$  on the lyrica. Refills given. Will give short course of pain medicine to get her through the holiday. Will get her into pain management. She does have somatic dysfunction that does seem to be contributing to her symptoms. Treated today with good results as below. Call with any concerns.

## 2022-11-25 NOTE — Assessment & Plan Note (Signed)
Will start HCTZ. Recheck 1 month. Call with any concerns.
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
## 2022-11-25 NOTE — Progress Notes (Signed)
BP (!) 155/85 (BP Location: Right Arm, Cuff Size: Normal)   Pulse 80   Temp 98 F (36.7 C) (Oral)   Ht 5' 3.5" (1.613 m)   Wt 191 lb 6.4 oz (86.8 kg)   SpO2 95%   BMI 33.37 kg/m    Subjective:    Patient ID: Kaitlyn Frazier, female    DOB: 20-Nov-1961, 61 y.o.   MRN: [id number]  HPI: Kaitlyn Frazier is a 60 y.o. female  Chief Complaint  Patient presents with   chronic pain syndrome   Has been having severe spasms in her low back was not able to sleep. She notes that it's getting into her buttock and into her groin. Pain is shooting and stabbing and severe. She notes that she can't sleep with it.   Did not do well with cutting her lyrica down. Was having issues with her sleep when she cut down. Continues to have issues with insomnia.   CHRONIC PAIN  Present dose: 0 Morphine equivalents Pain control status: exacerbated Duration: chronic Location: wide spread Quality: sharp and shooting and aching and sore Current Pain Level: moderate Previous Pain Level: severe Breakthrough pain: yes Previous pain specialty evaluation: yes Non-narcotic analgesic meds: yes Narcotic contract: N/A  HYPERTENSION  Hypertension status:  exacerbated   Satisfied with current treatment? no Duration of hypertension: chronic BP monitoring frequency:  not checking BP medication side effects:  no Medication compliance: N/A Aspirin: no Recurrent headaches: no Visual changes: no Palpitations: no Dyspnea: no Chest pain: no Lower extremity edema: no Dizzy/lightheaded: no   Relevant past medical, surgical, family and social history reviewed and updated as indicated. Interim medical history since our last visit reviewed. Allergies and medications reviewed and updated.  Review of Systems  Constitutional:  Positive for fatigue and unexpected weight change. Negative for activity change, appetite change, chills, diaphoresis and fever.  Respiratory: Negative.    Cardiovascular: Negative.    Gastrointestinal: Negative.   Musculoskeletal:  Positive for arthralgias, back pain and myalgias. Negative for gait problem, joint swelling, neck pain and neck stiffness.  Allergic/Immunologic: Negative.   Neurological: Negative.   Psychiatric/Behavioral:  Positive for dysphoric mood. Negative for agitation, behavioral problems, confusion, decreased concentration, hallucinations, self-injury, sleep disturbance and suicidal ideas. The patient is nervous/anxious. The patient is not hyperactive.     Per HPI unless specifically indicated above     Objective:    BP (!) 155/85 (BP Location: Right Arm, Cuff Size: Normal)   Pulse 80   Temp 98 F (36.7 C) (Oral)   Ht 5' 3.5" (1.613 m)   Wt 191 lb 6.4 oz (86.8 kg)   SpO2 95%   BMI 33.37 kg/m   Wt Readings from Last 3 Encounters:  11/25/22 191 lb 6.4 oz (86.8 kg)  10/20/22 181 lb 8 oz (82.3 kg)  10/13/22 181 lb 14.4 oz (82.5 kg)    Physical Exam Vitals and nursing note reviewed.  Constitutional:      General: She is not in acute distress.    Appearance: Normal appearance. She is not ill-appearing.  HENT:     Head: Normocephalic and atraumatic.     Right Ear: External ear normal.     Left Ear: External ear normal.     Nose: Nose normal.     Mouth/Throat:     Mouth: Mucous membranes are moist.     Pharynx: Oropharynx is clear.  Eyes:     Extraocular Movements: Extraocular movements intact.     Conjunctiva/sclera: Conjunctivae normal.  Pupils: Pupils are equal, round, and reactive to light.  Neck:     Vascular: No carotid bruit.  Cardiovascular:     Rate and Rhythm: Normal rate.     Pulses: Normal pulses.  Pulmonary:     Effort: Pulmonary effort is normal. No respiratory distress.  Abdominal:     General: Abdomen is flat. There is no distension.     Palpations: Abdomen is soft. There is no mass.     Tenderness: There is no abdominal tenderness. There is no right CVA tenderness, left CVA tenderness, guarding or rebound.      Hernia: No hernia is present.  Musculoskeletal:     Cervical back: No muscular tenderness.  Lymphadenopathy:     Cervical: No cervical adenopathy.  Skin:    General: Skin is warm and dry.     Capillary Refill: Capillary refill takes less than 2 seconds.     Coloration: Skin is not jaundiced or pale.     Findings: No bruising, erythema, lesion or rash.  Neurological:     General: No focal deficit present.     Mental Status: She is alert. Mental status is at baseline.  Psychiatric:        Mood and Affect: Mood normal.        Behavior: Behavior normal.        Thought Content: Thought content normal.        Judgment: Judgment normal.    Musculoskeletal:  Exam found Decreased ROM, Tissue texture changes, Tenderness to palpation, and Asymmetry of patient's  head, neck, thorax, ribs, lumbar, pelvis, sacrum, and abdomen Osteopathic Structural Exam:   Head: R SBS torsion, OAESSR, OM on the R compressed  Neck: C4ESRR, C5ESRL  Thorax: T1-4SLRR, trap spasm bilaterally R>L  Ribs: Rib 6 locked up on the R, Ribs 5-9 locked up on the L  Lumbar: QL hypertonic on the R L3-5SLRR  Pelvis: Anterior R innominate, piriformis spasm on the R  Sacrum: R on R torsion  Abdomen: diaphragm hypertonic bilaterally  Results for orders placed or performed in visit on 10/13/22  CBC with Differential/Platelet  Result Value Ref Range   WBC 6.1 3.4 - 10.8 x10E3/uL   RBC 4.40 3.77 - 5.28 x10E6/uL   Hemoglobin 12.7 11.1 - 15.9 g/dL   Hematocrit 38.8 34.0 - 46.6 %   MCV 88 79 - 97 fL   MCH 28.9 26.6 - 33.0 pg   MCHC 32.7 31.5 - 35.7 g/dL   RDW 12.6 11.7 - 15.4 %   Platelets 429 150 - 450 x10E3/uL   Neutrophils 53 Not Estab. %   Lymphs 37 Not Estab. %   Monocytes 8 Not Estab. %   Eos 1 Not Estab. %   Basos 1 Not Estab. %   Neutrophils Absolute 3.2 1.4 - 7.0 x10E3/uL   Lymphocytes Absolute 2.2 0.7 - 3.1 x10E3/uL   Monocytes Absolute 0.5 0.1 - 0.9 x10E3/uL   EOS (ABSOLUTE) 0.1 0.0 - 0.4 x10E3/uL   Basophils  Absolute 0.1 0.0 - 0.2 x10E3/uL   Immature Granulocytes 0 Not Estab. %   Immature Grans (Abs) 0.0 0.0 - 0.1 x10E3/uL  Comprehensive metabolic panel  Result Value Ref Range   Glucose 94 70 - 99 mg/dL   BUN 23 8 - 27 mg/dL   Creatinine, Ser 0.83 0.57 - 1.00 mg/dL   eGFR 80 >59 mL/min/1.73   BUN/Creatinine Ratio 28 12 - 28   Sodium 142 134 - 144 mmol/L   Potassium 4.4 3.5 -  5.2 mmol/L   Chloride 100 96 - 106 mmol/L   CO2 24 20 - 29 mmol/L   Calcium 9.9 8.7 - 10.3 mg/dL   Total Protein 7.1 6.0 - 8.5 g/dL   Albumin 4.8 3.9 - 4.9 g/dL   Globulin, Total 2.3 1.5 - 4.5 g/dL   Albumin/Globulin Ratio 2.1 1.2 - 2.2   Bilirubin Total 0.3 0.0 - 1.2 mg/dL   Alkaline Phosphatase 83 44 - 121 IU/L   AST 22 0 - 40 IU/L   ALT 23 0 - 32 IU/L  Lipid Panel w/o Chol/HDL Ratio  Result Value Ref Range   Cholesterol, Total 284 (H) 100 - 199 mg/dL   Triglycerides 149 0 - 149 mg/dL   HDL 84 >39 mg/dL   VLDL Cholesterol Cal 26 5 - 40 mg/dL   LDL Chol Calc (NIH) 174 (H) 0 - 99 mg/dL  Urinalysis, Routine w reflex microscopic  Result Value Ref Range   Specific Gravity, UA 1.010 1.005 - 1.030   pH, UA 7.0 5.0 - 7.5   Color, UA Yellow Yellow   Appearance Ur Clear Clear   Leukocytes,UA Negative Negative   Protein,UA Negative Negative/Trace   Glucose, UA Negative Negative   Ketones, UA Negative Negative   RBC, UA Negative Negative   Bilirubin, UA Negative Negative   Urobilinogen, Ur 0.2 0.2 - 1.0 mg/dL   Nitrite, UA Negative Negative   Microscopic Examination Comment   TSH  Result Value Ref Range   TSH 1.030 0.450 - 4.500 uIU/mL  Microalbumin, Urine Waived  Result Value Ref Range   Microalb, Ur Waived 10 0 - 19 mg/L   Creatinine, Urine Waived 10 10 - 300 mg/dL   Microalb/Creat Ratio <30 <30 mg/g  VITAMIN D 25 Hydroxy (Vit-D Deficiency, Fractures)  Result Value Ref Range   Vit D, 25-Hydroxy 28.6 (L) 30.0 - 100.0 ng/mL  ANA+ENA+DNA/DS+Antich+Centr  Result Value Ref Range   ANA Titer 1 Negative     dsDNA Ab <1 0 - 9 IU/mL   ENA RNP Ab <0.2 0.0 - 0.9 AI   ENA SM Ab Ser-aCnc <0.2 0.0 - 0.9 AI   Scleroderma (Scl-70) (ENA) Antibody, IgG <0.2 0.0 - 0.9 AI   ENA SSA (RO) Ab <0.2 0.0 - 0.9 AI   ENA SSB (LA) Ab <0.2 0.0 - 0.9 AI   Chromatin Ab SerPl-aCnc <0.2 0.0 - 0.9 AI   Anti JO-1 <0.2 0.0 - 0.9 AI   Centromere Ab Screen <0.2 0.0 - 0.9 AI   See below: Comment   CK  Result Value Ref Range   Total CK 174 32 - 182 U/L  Uric acid  Result Value Ref Range   Uric Acid 4.7 3.0 - 7.2 mg/dL  RA Qn+CCP(IgG/A)+SjoSSA+SjoSSB  Result Value Ref Range   Rhuematoid fact SerPl-aCnc 10.4 <51.7 IU/mL   Cyclic Citrullin Peptide Ab 2 0 - 19 units      Assessment & Plan:   Problem List Items Addressed This Visit       Cardiovascular and Mediastinum   Benign essential hypertension    Will start HCTZ. Recheck 1 month. Call with any concerns.       Relevant Medications   hydrochlorothiazide (HYDRODIURIL) 25 MG tablet     Respiratory   Obstructive sleep apnea syndrome    Working with sleep medicine. Await CPAP. Name of TMJ specialist given today.        Other   Chronic pain syndrome - Primary    In exacerbation. Did  not do well with cutting down on lyrica and has been having spasms off muscle relaxer. Will restart methocarbamol daily to see if that helps and go back up to the 195m on the lyrica. Refills given. Will give short course of pain medicine to get her through the holiday. Will get her into pain management. She does have somatic dysfunction that does seem to be contributing to her symptoms. Treated today with good results as below. Call with any concerns.       Relevant Medications   methocarbamol (ROBAXIN) 500 MG tablet   pregabalin (LYRICA) 150 MG capsule   HYDROcodone-acetaminophen (NORCO) 5-325 MG tablet   Other Relevant Orders   Ambulatory referral to Pain Clinic   Other Visit Diagnoses     Piriformis syndrome of right side       Would like trigger point injections- I do  not do piriformis trigger point injections. Will refer to sports med. Await their input.   Relevant Medications   methocarbamol (ROBAXIN) 500 MG tablet   pregabalin (LYRICA) 150 MG capsule   Other Relevant Orders   Ambulatory referral to Sports Medicine   Head region somatic dysfunction       Cervical segment dysfunction       Thoracic segment dysfunction       Somatic dysfunction of lumbar region       Somatic dysfunction of sacral region       Somatic dysfunction of pelvis region       Rib cage region somatic dysfunction       Segmental dysfunction of abdomen          After verbal consent was obtained, patient was treated today with osteopathic manipulative medicine to the regions of the head, neck, thorax, ribs, lumbar, pelvis, sacrum, and abdomen using the techniques of cranial, myofascial release, counterstrain, muscle energy, HVLA, and soft tissue. Areas of compensation relating to her primary pain source also treated. Patient tolerated the procedure well with good objective and fair subjective improvement in symptoms. She left the room in good condition. She was advised to stay well hydrated and that she may have some soreness following the procedure. If not improving or worsening, he will call and come in. She will return for reevaluation  on a PRN basis.   Follow up plan: Return in about 4 weeks (around 12/23/2022) for regular appt.

## 2022-11-26 NOTE — Telephone Encounter (Signed)
Requested medication (s) are due for refill today: no  Requested medication (s) are on the active medication list: yes  Last refill:  11/25/22  Future visit scheduled: yes  Notes to clinic:  Please see note from pharmacy Pharmacy comment: Alternative Requested:[facility].     Requested Prescriptions  Pending Prescriptions Disp Refills   methocarbamol (ROBAXIN) 500 MG tablet [Pharmacy Med Name: METHOCARBAMOL 500 MG TABLET] 270 tablet 1    Sig: TAKE 1 TABLET BY MOUTH THREE TIMES A DAY     Not Delegated - Analgesics:  Muscle Relaxants Failed - 11/25/2022  5:00 PM      Failed - This refill cannot be delegated      Passed - Valid encounter within last 6 months    Recent Outpatient Visits           Yesterday Chronic pain syndrome   Mifflin, DO   1 month ago Chronic pain syndrome   Bee, DO   1 month ago Benign essential hypertension   Ramona, Barb Merino, DO       Future Appointments             In 3 weeks Wannetta Sender Governor Rooks, MD Urogynecology at [facility], [facility]   In 3 weeks Wynetta Emery, Barb Merino, DO [facility], [place]

## 2022-12-02 ENCOUNTER — Ambulatory Visit: Payer: PRIVATE HEALTH INSURANCE

## 2022-12-03 ENCOUNTER — Encounter: Payer: Medicare Other | Admitting: Family Medicine
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
## 2022-12-09 ENCOUNTER — Encounter: Payer: Self-pay | Admitting: Surgery

## 2022-12-09 ENCOUNTER — Telehealth: Payer: Self-pay | Admitting: Family Medicine

## 2022-12-09 ENCOUNTER — Ambulatory Visit: Payer: Medicare Other | Admitting: Surgery

## 2022-12-09 ENCOUNTER — Other Ambulatory Visit: Payer: Self-pay

## 2022-12-09 VITALS — BP 152/84 | HR 94 | Temp 98.3°F | Ht 63.0 in | Wt 192.0 lb

## 2022-12-09 DIAGNOSIS — R1310 Dysphagia, unspecified: Secondary | ICD-10-CM | POA: Diagnosis not present

## 2022-12-09 DIAGNOSIS — D170 Benign lipomatous neoplasm of skin and subcutaneous tissue of head, face and neck: Secondary | ICD-10-CM | POA: Diagnosis not present

## 2022-12-09 NOTE — Telephone Encounter (Signed)
Copied from [place] [id number]. Topic: Medical Record Request - Provider/Facility Request >> Dec 09, 2022  9:32 AM Everette C wrote: Reason for CRM: Inez Catalina with [facility] has called to share that the referral has been received and additional office notes for the patient are needed   Please send the information via fax to [phone] Attention Inez Catalina   Please contact further if needed

## 2022-12-09 NOTE — Patient Instructions (Signed)
Please call the office with any questions or concerns.

## 2022-12-11 DIAGNOSIS — R1310 Dysphagia, unspecified: Secondary | ICD-10-CM | POA: Insufficient documentation

## 2022-12-11 DIAGNOSIS — D170 Benign lipomatous neoplasm of skin and subcutaneous tissue of head, face and neck: Secondary | ICD-10-CM | POA: Insufficient documentation

## 2022-12-13 ENCOUNTER — Ambulatory Visit: Payer: Medicare Other | Admitting: Physical Therapy

## 2022-12-17 ENCOUNTER — Ambulatory Visit: Payer: Medicare Other | Admitting: Obstetrics and Gynecology

## 2022-12-17 NOTE — Progress Notes (Deleted)
[facility] Return Visit  SUBJECTIVE  History of Present Illness: Kaitlyn Frazier is a 62 y.o. female seen in follow-up for urethral pain, incontinence, prolapse and constipation. Plan at last visit was ***.   Has been attending physical therapy.   Past Medical History: Patient  has a past medical history of Abnormal vaginal Pap smear, Depression, Fibromyalgia, hx of Conversion disorder, hx of Peritonitis ([place]), Mitral valve regurgitation, Sleep apnea, and Thyroid nodule.   Past Surgical History: She  has a past surgical history that includes Abdominal hysterectomy; Inguinal hernia repair; Cesarean section; Anterior lat lumbar fusion; Anterior / posterior combined fusion cervical spine; Nose surgery; Tumor removal; and Breast biopsy (N/A).   Medications: She has a current medication list which includes the following prescription(s): albuterol, bupropion, vitamin d-3, duloxetine, hydrochlorothiazide, hydroxyzine, methocarbamol, metronidazole, omeprazole, pregabalin, and trimethoprim-polymyxin b.   Allergies: Patient is allergic to gabapentin, amitriptyline, baclofen, cyclobenzaprine, doxepin, latex, norflex tablets [orphenadrine], pollen extract, tizanidine, and other.   Social History: Patient  reports that she has never smoked. She has never been exposed to tobacco smoke. She has never used smokeless tobacco. She reports that she does not drink alcohol and does not use drugs.      OBJECTIVE     Physical Exam: There were no vitals filed for this visit. Gen: No apparent distress, A&O x 3.  Detailed Urogynecologic Evaluation:  Deferred. Prior exam showed:      No data to display             ASSESSMENT AND PLAN    Kaitlyn Frazier is a 62 y.o. with:  No diagnosis found.

## 2022-12-20 ENCOUNTER — Encounter: Payer: Self-pay | Admitting: Physical Therapy

## 2022-12-20 ENCOUNTER — Ambulatory Visit: Payer: Medicare Other | Attending: Obstetrics and Gynecology | Admitting: Physical Therapy

## 2022-12-20 DIAGNOSIS — R278 Other lack of coordination: Secondary | ICD-10-CM | POA: Insufficient documentation

## 2022-12-20 DIAGNOSIS — M6289 Other specified disorders of muscle: Secondary | ICD-10-CM | POA: Diagnosis present

## 2022-12-20 DIAGNOSIS — M6281 Muscle weakness (generalized): Secondary | ICD-10-CM | POA: Diagnosis present

## 2022-12-20 DIAGNOSIS — M5459 Other low back pain: Secondary | ICD-10-CM | POA: Insufficient documentation
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
## 2022-12-20 NOTE — Telephone Encounter (Signed)
Kaitlyn Frazier is calling to report that she is not receiving office visit notes. Notes include demographic, referral.  Kaitlyn Frazier is needing more.  CB- [phone]

## 2022-12-21 NOTE — Addendum Note (Signed)
Addended by: Louie Casa on: 12/21/2022 10:49 AM   Modules accepted: Orders

## 2022-12-22 NOTE — Telephone Encounter (Signed)
Betty from [facility], states still needs office notes from referral for pain fx is [phone].

## 2022-12-23 ENCOUNTER — Encounter: Payer: Self-pay | Admitting: Family Medicine

## 2022-12-23 ENCOUNTER — Ambulatory Visit (INDEPENDENT_AMBULATORY_CARE_PROVIDER_SITE_OTHER): Payer: Medicare Other | Admitting: Family Medicine

## 2022-12-23 VITALS — BP 138/79 | HR 89 | Temp 98.8°F | Ht 63.0 in | Wt 190.7 lb

## 2022-12-23 DIAGNOSIS — G4700 Insomnia, unspecified: Secondary | ICD-10-CM

## 2022-12-23 DIAGNOSIS — I10 Essential (primary) hypertension: Secondary | ICD-10-CM | POA: Diagnosis not present

## 2022-12-23 MED ORDER — HYDROCHLOROTHIAZIDE 25 MG PO TABS: 25.0000 mg | ORAL_TABLET | Freq: Every day | ORAL | 1 refills | Status: DC

## 2022-12-23 MED ORDER — ESZOPICLONE 1 MG PO TABS: 1.0000 mg | ORAL_TABLET | Freq: Every evening | ORAL | 0 refills | Status: DC | PRN

## 2022-12-23 MED ORDER — OMEPRAZOLE 40 MG PO CPDR: 40.0000 mg | DELAYED_RELEASE_CAPSULE | Freq: Every day | ORAL | 1 refills | Status: DC

## 2022-12-23 NOTE — Assessment & Plan Note (Signed)
Under good control on current regimen. Continue current regimen. Continue to monitor. Call with any concerns. Refills given. Labs drawn today.

## 2022-12-23 NOTE — Progress Notes (Signed)
BP 138/79   Pulse 89   Temp 98.8 F (37.1 C) (Oral)   Ht '5\' 3"'$  (1.6 m)   Wt 190 lb 11.2 oz (86.5 kg)   SpO2 97%   BMI 33.78 kg/m    Subjective:    Patient ID: Kaitlyn Frazier, female    DOB: [birth year], 62 y.o.   MRN: [id number]  HPI: Kaitlyn Frazier is a 62 y.o. female  Chief Complaint  Patient presents with   Hypertension   HYPERTENSION  Hypertension status: better  Satisfied with current treatment? yes Duration of hypertension: chronic BP monitoring frequency:  not checking BP medication side effects:  no Medication compliance: excellent compliance Previous BP meds:HCTZ Aspirin: no Recurrent headaches: no Visual changes: no Palpitations: yes. With driving Dyspnea: no Chest pain: no Lower extremity edema: no Dizzy/lightheaded: no  INSOMNIA- has tried doxipin, trazodone, ambien, hydroxyzine Duration: chronic Satisfied with sleep quality: no Difficulty falling asleep: yes Difficulty staying asleep: yes Waking a few hours after sleep onset: yes Early morning awakenings: no Daytime hypersomnolence: yes Wakes feeling refreshed: no Good sleep hygiene: yes Apnea: no Snoring: no Depressed/anxious mood: yes Recent stress: yes Restless legs/nocturnal leg cramps: no Chronic pain/arthritis: no History of sleep study: no  Relevant past medical, surgical, family and social history reviewed and updated as indicated. Interim medical history since our last visit reviewed. Allergies and medications reviewed and updated.  Review of Systems  Constitutional: Negative.   Respiratory: Negative.    Cardiovascular: Negative.   Endocrine: Positive for heat intolerance. Negative for cold intolerance, polydipsia, polyphagia and polyuria.  Musculoskeletal: Negative.   Skin: Negative.   Psychiatric/Behavioral:  Positive for sleep disturbance. Negative for agitation, behavioral problems, confusion, decreased concentration, dysphoric mood, hallucinations, self-injury and  suicidal ideas. The patient is not nervous/anxious and is not hyperactive.     Per HPI unless specifically indicated above     Objective:    BP 138/79   Pulse 89   Temp 98.8 F (37.1 C) (Oral)   Ht '5\' 3"'$  (1.6 m)   Wt 190 lb 11.2 oz (86.5 kg)   SpO2 97%   BMI 33.78 kg/m   Wt Readings from Last 3 Encounters:  12/23/22 190 lb 11.2 oz (86.5 kg)  12/09/22 192 lb (87.1 kg)  11/25/22 191 lb 6.4 oz (86.8 kg)    Physical Exam Vitals and nursing note reviewed.  Constitutional:      General: She is not in acute distress.    Appearance: Normal appearance. She is obese. She is not ill-appearing, toxic-appearing or diaphoretic.  HENT:     Head: Normocephalic and atraumatic.     Right Ear: External ear normal.     Left Ear: External ear normal.     Nose: Nose normal.     Mouth/Throat:     Mouth: Mucous membranes are moist.     Pharynx: Oropharynx is clear.  Eyes:     General: No scleral icterus.       Right eye: No discharge.        Left eye: No discharge.     Extraocular Movements: Extraocular movements intact.     Conjunctiva/sclera: Conjunctivae normal.     Pupils: Pupils are equal, round, and reactive to light.  Cardiovascular:     Rate and Rhythm: Normal rate and regular rhythm.     Pulses: Normal pulses.     Heart sounds: Normal heart sounds. No murmur heard.    No friction rub. No gallop.  Pulmonary:  Effort: Pulmonary effort is normal. No respiratory distress.     Breath sounds: Normal breath sounds. No stridor. No wheezing, rhonchi or rales.  Chest:     Chest wall: No tenderness.  Musculoskeletal:        General: Normal range of motion.     Cervical back: Normal range of motion and neck supple.  Skin:    General: Skin is warm and dry.     Capillary Refill: Capillary refill takes less than 2 seconds.     Coloration: Skin is not jaundiced or pale.     Findings: No bruising, erythema, lesion or rash.  Neurological:     General: No focal deficit present.      Mental Status: She is alert and oriented to person, place, and time. Mental status is at baseline.  Psychiatric:        Mood and Affect: Mood normal.        Behavior: Behavior normal.        Thought Content: Thought content normal.        Judgment: Judgment normal.     Results for orders placed or performed in visit on 10/13/22  CBC with Differential/Platelet  Result Value Ref Range   WBC 6.1 3.4 - 10.8 x10E3/uL   RBC 4.40 3.77 - 5.28 x10E6/uL   Hemoglobin 12.7 11.1 - 15.9 g/dL   Hematocrit 38.8 34.0 - 46.6 %   MCV 88 79 - 97 fL   MCH 28.9 26.6 - 33.0 pg   MCHC 32.7 31.5 - 35.7 g/dL   RDW 12.6 11.7 - 15.4 %   Platelets 429 150 - 450 x10E3/uL   Neutrophils 53 Not Estab. %   Lymphs 37 Not Estab. %   Monocytes 8 Not Estab. %   Eos 1 Not Estab. %   Basos 1 Not Estab. %   Neutrophils Absolute 3.2 1.4 - 7.0 x10E3/uL   Lymphocytes Absolute 2.2 0.7 - 3.1 x10E3/uL   Monocytes Absolute 0.5 0.1 - 0.9 x10E3/uL   EOS (ABSOLUTE) 0.1 0.0 - 0.4 x10E3/uL   Basophils Absolute 0.1 0.0 - 0.2 x10E3/uL   Immature Granulocytes 0 Not Estab. %   Immature Grans (Abs) 0.0 0.0 - 0.1 x10E3/uL  Comprehensive metabolic panel  Result Value Ref Range   Glucose 94 70 - 99 mg/dL   BUN 23 8 - 27 mg/dL   Creatinine, Ser 0.83 0.57 - 1.00 mg/dL   eGFR 80 >59 mL/min/1.73   BUN/Creatinine Ratio 28 12 - 28   Sodium 142 134 - 144 mmol/L   Potassium 4.4 3.5 - 5.2 mmol/L   Chloride 100 96 - 106 mmol/L   CO2 24 20 - 29 mmol/L   Calcium 9.9 8.7 - 10.3 mg/dL   Total Protein 7.1 6.0 - 8.5 g/dL   Albumin 4.8 3.9 - 4.9 g/dL   Globulin, Total 2.3 1.5 - 4.5 g/dL   Albumin/Globulin Ratio 2.1 1.2 - 2.2   Bilirubin Total 0.3 0.0 - 1.2 mg/dL   Alkaline Phosphatase 83 44 - 121 IU/L   AST 22 0 - 40 IU/L   ALT 23 0 - 32 IU/L  Lipid Panel w/o Chol/HDL Ratio  Result Value Ref Range   Cholesterol, Total 284 (H) 100 - 199 mg/dL   Triglycerides 149 0 - 149 mg/dL   HDL 84 >39 mg/dL   VLDL Cholesterol Cal 26 5 - 40 mg/dL    LDL Chol Calc (NIH) 174 (H) 0 - 99 mg/dL  Urinalysis, Routine w reflex microscopic  Result Value Ref Range   Specific Gravity, UA 1.010 1.005 - 1.030   pH, UA 7.0 5.0 - 7.5   Color, UA Yellow Yellow   Appearance Ur Clear Clear   Leukocytes,UA Negative Negative   Protein,UA Negative Negative/Trace   Glucose, UA Negative Negative   Ketones, UA Negative Negative   RBC, UA Negative Negative   Bilirubin, UA Negative Negative   Urobilinogen, Ur 0.2 0.2 - 1.0 mg/dL   Nitrite, UA Negative Negative   Microscopic Examination Comment   TSH  Result Value Ref Range   TSH 1.030 0.450 - 4.500 uIU/mL  Microalbumin, Urine Waived  Result Value Ref Range   Microalb, Ur Waived 10 0 - 19 mg/L   Creatinine, Urine Waived 10 10 - 300 mg/dL   Microalb/Creat Ratio <30 <30 mg/g  VITAMIN D 25 Hydroxy (Vit-D Deficiency, Fractures)  Result Value Ref Range   Vit D, 25-Hydroxy 28.6 (L) 30.0 - 100.0 ng/mL  ANA+ENA+DNA/DS+Antich+Centr  Result Value Ref Range   ANA Titer 1 Negative    dsDNA Ab <1 0 - 9 IU/mL   ENA RNP Ab <0.2 0.0 - 0.9 AI   ENA SM Ab Ser-aCnc <0.2 0.0 - 0.9 AI   Scleroderma (Scl-70) (ENA) Antibody, IgG <0.2 0.0 - 0.9 AI   ENA SSA (RO) Ab <0.2 0.0 - 0.9 AI   ENA SSB (LA) Ab <0.2 0.0 - 0.9 AI   Chromatin Ab SerPl-aCnc <0.2 0.0 - 0.9 AI   Anti JO-1 <0.2 0.0 - 0.9 AI   Centromere Ab Screen <0.2 0.0 - 0.9 AI   See below: Comment   CK  Result Value Ref Range   Total CK 174 32 - 182 U/L  Uric acid  Result Value Ref Range   Uric Acid 4.7 3.0 - 7.2 mg/dL  RA Qn+CCP(IgG/A)+SjoSSA+SjoSSB  Result Value Ref Range   Rhuematoid fact SerPl-aCnc 10.4 <35.5 IU/mL   Cyclic Citrullin Peptide Ab 2 0 - 19 units      Assessment & Plan:   Problem List Items Addressed This Visit       Cardiovascular and Mediastinum   Benign essential hypertension - Primary    Under good control on current regimen. Continue current regimen. Continue to monitor. Call with any concerns. Refills given. Labs drawn  today.       Relevant Medications   hydrochlorothiazide (HYDRODIURIL) 25 MG tablet   Other Relevant Orders   Basic metabolic panel     Other   Insomnia    Not doing well still. Will try her on lunesta and recheck in about a month. Call with any concerns.         Follow up plan: Return in about 4 weeks (around 01/20/2023).

## 2022-12-23 NOTE — Assessment & Plan Note (Signed)
Not doing well still. Will try her on lunesta and recheck in about a month. Call with any concerns.

## 2022-12-23 NOTE — Patient Instructions (Signed)
Please call to schedule your mammogram and/or bone density: [facility]  Address: [address], [place], [place] [address] Phone: [phone]  [facility] [place] [address] [place],  [place]  [address] Phone: [phone]   

## 2022-12-24 LAB — BASIC METABOLIC PANEL
BUN/Creatinine Ratio: 22 (ref 12–28)
BUN: 17 mg/dL (ref 8–27)
CO2: 24 mmol/L (ref 20–29)
Calcium: 10 mg/dL (ref 8.7–10.3)
Chloride: 99 mmol/L (ref 96–106)
Creatinine, Ser: 0.79 mg/dL (ref 0.57–1.00)
Glucose: 98 mg/dL (ref 70–99)
Potassium: 3.9 mmol/L (ref 3.5–5.2)
Sodium: 142 mmol/L (ref 134–144)
eGFR: 85 mL/min/{1.73_m2} (ref >59)

## 2022-12-27 ENCOUNTER — Ambulatory Visit: Payer: Medicare Other | Admitting: Physical Therapy

## 2022-12-27 ENCOUNTER — Encounter: Payer: Self-pay | Admitting: Physical Therapy

## 2022-12-27 DIAGNOSIS — M6281 Muscle weakness (generalized): Secondary | ICD-10-CM

## 2022-12-27 DIAGNOSIS — M6289 Other specified disorders of muscle: Secondary | ICD-10-CM | POA: Diagnosis not present

## 2022-12-27 DIAGNOSIS — R278 Other lack of coordination: Secondary | ICD-10-CM

## 2022-12-27 DIAGNOSIS — M5459 Other low back pain: Secondary | ICD-10-CM
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
## 2023-01-03 ENCOUNTER — Encounter: Payer: Self-pay | Admitting: Physical Therapy

## 2023-01-03 ENCOUNTER — Ambulatory Visit: Payer: Medicare Other | Admitting: Physical Therapy

## 2023-01-03 DIAGNOSIS — M5459 Other low back pain: Secondary | ICD-10-CM

## 2023-01-03 DIAGNOSIS — M6289 Other specified disorders of muscle: Secondary | ICD-10-CM

## 2023-01-03 DIAGNOSIS — M6281 Muscle weakness (generalized): Secondary | ICD-10-CM

## 2023-01-03 DIAGNOSIS — R278 Other lack of coordination: Secondary | ICD-10-CM
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
## 2023-01-06 ENCOUNTER — Ambulatory Visit
Admission: RE | Admit: 2023-01-06 | Discharge: 2023-01-06 | Disposition: A | Discharge status: Home / Self Care | Payer: Medicare Other | Attending: Nurse Practitioner | Admitting: Nurse Practitioner

## 2023-01-06 ENCOUNTER — Other Ambulatory Visit: Payer: Self-pay | Admitting: Nurse Practitioner

## 2023-01-06 ENCOUNTER — Ambulatory Visit
Admission: RE | Admit: 2023-01-06 | Discharge: 2023-01-06 | Disposition: A | Discharge status: Home / Self Care | Payer: Medicare Other | Source: Ambulatory Visit | Attending: Nurse Practitioner | Admitting: Nurse Practitioner

## 2023-01-06 DIAGNOSIS — M549 Dorsalgia, unspecified: Secondary | ICD-10-CM | POA: Insufficient documentation

## 2023-01-06 DIAGNOSIS — M542 Cervicalgia: Secondary | ICD-10-CM

## 2023-01-06 DIAGNOSIS — M545 Low back pain, unspecified: Secondary | ICD-10-CM | POA: Diagnosis present

## 2023-01-07 ENCOUNTER — Ambulatory Visit: Payer: Medicare Other | Admitting: Family Medicine

## 2023-01-10 ENCOUNTER — Ambulatory Visit: Payer: Medicare Other | Attending: Obstetrics and Gynecology | Admitting: Physical Therapy

## 2023-01-10 ENCOUNTER — Encounter: Payer: Self-pay | Admitting: Physical Therapy

## 2023-01-10 DIAGNOSIS — M6289 Other specified disorders of muscle: Secondary | ICD-10-CM | POA: Diagnosis present

## 2023-01-10 DIAGNOSIS — M6281 Muscle weakness (generalized): Secondary | ICD-10-CM

## 2023-01-10 DIAGNOSIS — M5459 Other low back pain: Secondary | ICD-10-CM

## 2023-01-10 DIAGNOSIS — R278 Other lack of coordination: Secondary | ICD-10-CM | POA: Diagnosis present
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
## 2023-01-11 ENCOUNTER — Other Ambulatory Visit: Payer: Self-pay | Admitting: Family Medicine

## 2023-01-11 ENCOUNTER — Encounter: Payer: Self-pay | Admitting: Family Medicine

## 2023-01-11 MED ORDER — ESZOPICLONE 2 MG PO TABS: 2.0000 mg | ORAL_TABLET | Freq: Every evening | ORAL | 0 refills | Status: DC | PRN

## 2023-01-12 ENCOUNTER — Encounter: Payer: Self-pay | Admitting: Family Medicine

## 2023-01-12 ENCOUNTER — Ambulatory Visit (INDEPENDENT_AMBULATORY_CARE_PROVIDER_SITE_OTHER): Payer: Medicare Other | Admitting: Family Medicine

## 2023-01-12 VITALS — BP 139/79 | HR 89 | Temp 98.5°F | Ht 63.0 in | Wt 194.4 lb

## 2023-01-12 DIAGNOSIS — G894 Chronic pain syndrome: Secondary | ICD-10-CM

## 2023-01-12 DIAGNOSIS — F331 Major depressive disorder, recurrent, moderate: Secondary | ICD-10-CM

## 2023-01-12 DIAGNOSIS — D485 Neoplasm of uncertain behavior of skin: Secondary | ICD-10-CM

## 2023-01-12 DIAGNOSIS — G4700 Insomnia, unspecified: Secondary | ICD-10-CM

## 2023-01-12 DIAGNOSIS — I10 Essential (primary) hypertension: Secondary | ICD-10-CM | POA: Diagnosis not present

## 2023-01-12 DIAGNOSIS — L409 Psoriasis, unspecified: Secondary | ICD-10-CM

## 2023-01-12 DIAGNOSIS — R5383 Other fatigue: Secondary | ICD-10-CM

## 2023-01-12 DIAGNOSIS — M2550 Pain in unspecified joint: Secondary | ICD-10-CM

## 2023-01-12 MED ORDER — ESZOPICLONE 2 MG PO TABS: 2.0000 mg | ORAL_TABLET | Freq: Every evening | ORAL | 5 refills | Status: DC | PRN

## 2023-01-12 NOTE — Progress Notes (Signed)
BP 139/79   Pulse 89   Temp 98.5 F (36.9 C) (Oral)   Ht '5\' 3"'$  (1.6 m)   Wt 194 lb 6.4 oz (88.2 kg)   SpO2 97%   BMI 34.44 kg/m    Subjective:    Patient ID: Kaitlyn Frazier, female    DOB: [birth year], 62 y.o.   MRN: [id number]  HPI: Kaitlyn Frazier is a 62 y.o. female  Chief Complaint  Patient presents with   Hypertension   Skin Problem    Patient says her PT informed her they found a suspicious spot on her left side this past Monday and to have her PCP take a look.    HYPERTENSION  Hypertension status: better  Satisfied with current treatment? yes Duration of hypertension: chronic BP monitoring frequency:  not checking BP medication side effects:  no Medication compliance: excellent compliance Previous BP meds: Aspirin: no Recurrent headaches: no Visual changes: no Palpitations: no Dyspnea: no Chest pain: no Lower extremity edema: no Dizzy/lightheaded: no  INSOMNIA Duration: chronic Satisfied with sleep quality: yes Difficulty falling asleep: yes Difficulty staying asleep: no Waking a few hours after sleep onset: no Early morning awakenings: no Daytime hypersomnolence: no Wakes feeling refreshed: no Good sleep hygiene: no Apnea: no Snoring: no Depressed/anxious mood: yes Recent stress: no Restless legs/nocturnal leg cramps: no Chronic pain/arthritis: no History of sleep study: no Treatments attempted:  lunesta, melatonin, uinsom, and benadryl   DEPRESSION- seeing psychiatry, seeing them again next week, feels like she is dragging emotionally Mood status: uncontrolled Satisfied with current treatment?: no Symptom severity: moderate  Duration of current treatment : chronic Side effects: no Medication compliance: excellent compliance Psychotherapy/counseling: yes current Depressed mood: yes Anxious mood: yes Anhedonia: yes Significant weight loss or gain: no Insomnia: no  Fatigue: yes Feelings of worthlessness or guilt: no Impaired  concentration/indecisiveness: no Suicidal ideations: no Hopelessness: no Crying spells: no    01/12/2023    8:12 AM 12/23/2022    3:01 PM 11/24/2022    4:33 PM 10/13/2022    2:51 PM 09/23/2022    8:34 AM  Depression screen PHQ 2/9  Decreased Interest '3 1  2   '$ Down, Depressed, Hopeless '3 1  1   '$ PHQ - 2 Score '6 2  3   '$ Altered sleeping '2 3  3   '$ Tired, decreased energy '3 3  3   '$ Change in appetite '3 2  2   '$ Feeling bad or failure about yourself  '3 1  1   '$ Trouble concentrating 1 0  1   Moving slowly or fidgety/restless 1 0  0   Suicidal thoughts 0 0  0   PHQ-9 Score '19 11  13   '$ Difficult doing work/chores    Somewhat difficult      Information is confidential and restricted. Go to Review Flowsheets to unlock data.     Relevant past medical, surgical, family and social history reviewed and updated as indicated. Interim medical history since our last visit reviewed. Allergies and medications reviewed and updated.  Review of Systems  Constitutional: Negative.   Respiratory: Negative.    Cardiovascular: Negative.   Gastrointestinal: Negative.   Musculoskeletal:  Positive for arthralgias, myalgias and neck pain. Negative for back pain, gait problem, joint swelling and neck stiffness.  Skin: Negative.   Psychiatric/Behavioral: Negative.      Per HPI unless specifically indicated above     Objective:    BP 139/79   Pulse 89   Temp 98.5 F (36.9  C) (Oral)   Ht '5\' 3"'$  (1.6 m)   Wt 194 lb 6.4 oz (88.2 kg)   SpO2 97%   BMI 34.44 kg/m   Wt Readings from Last 3 Encounters:  01/12/23 194 lb 6.4 oz (88.2 kg)  12/23/22 190 lb 11.2 oz (86.5 kg)  12/09/22 192 lb (87.1 kg)    Physical Exam Vitals and nursing note reviewed.  Constitutional:      General: She is not in acute distress.    Appearance: Normal appearance. She is not ill-appearing, toxic-appearing or diaphoretic.  HENT:     Head: Normocephalic and atraumatic.     Right Ear: External ear normal.     Left Ear: External  ear normal.     Nose: Nose normal.     Mouth/Throat:     Mouth: Mucous membranes are moist.     Pharynx: Oropharynx is clear.  Eyes:     General: No scleral icterus.       Right eye: No discharge.        Left eye: No discharge.     Extraocular Movements: Extraocular movements intact.     Conjunctiva/sclera: Conjunctivae normal.     Pupils: Pupils are equal, round, and reactive to light.  Cardiovascular:     Rate and Rhythm: Normal rate and regular rhythm.     Pulses: Normal pulses.     Heart sounds: Normal heart sounds. No murmur heard.    No friction rub. No gallop.  Pulmonary:     Effort: Pulmonary effort is normal. No respiratory distress.     Breath sounds: Normal breath sounds. No stridor. No wheezing, rhonchi or rales.  Chest:     Chest wall: No tenderness.  Musculoskeletal:        General: Normal range of motion.     Cervical back: Normal range of motion and neck supple.  Skin:    General: Skin is warm and dry.     Capillary Refill: Capillary refill takes less than 2 seconds.     Coloration: Skin is not jaundiced or pale.     Findings: No bruising, erythema, lesion or rash.     Comments: Stuck on lesion on L side  Neurological:     General: No focal deficit present.     Mental Status: She is alert and oriented to person, place, and time. Mental status is at baseline.  Psychiatric:        Mood and Affect: Mood normal.        Behavior: Behavior normal.        Thought Content: Thought content normal.        Judgment: Judgment normal.     Results for orders placed or performed in visit on 61/95/09  Basic metabolic panel  Result Value Ref Range   Glucose 98 70 - 99 mg/dL   BUN 17 8 - 27 mg/dL   Creatinine, Ser 0.79 0.57 - 1.00 mg/dL   eGFR 85 >59 mL/min/1.73   BUN/Creatinine Ratio 22 12 - 28   Sodium 142 134 - 144 mmol/L   Potassium 3.9 3.5 - 5.2 mmol/L   Chloride 99 96 - 106 mmol/L   CO2 24 20 - 29 mmol/L   Calcium 10.0 8.7 - 10.3 mg/dL      Assessment &  Plan:   Problem List Items Addressed This Visit       Cardiovascular and Mediastinum   Benign essential hypertension - Primary    Under good control on current regimen. Continue  current regimen. Continue to monitor. Call with any concerns. Refills given.        Relevant Orders   Basic metabolic panel     Other   Moderate recurrent major depression ([place])    Still not doing great. Continue to follow with psychiatry. Call with any concerns.       Insomnia    Under good control on current regimen. Continue current regimen. Continue to monitor. Call with any concerns. Refills given.        Chronic pain syndrome    Would like to discuss trigger point injections. Would like to see pm&r- referral generated today.      Relevant Medications   traMADol (ULTRAM) 50 MG tablet   diclofenac (VOLTAREN) 50 MG EC tablet   Other Relevant Orders   Ambulatory referral to Physical Medicine Rehab   Other Visit Diagnoses     Fatigue, unspecified type       Will check on labs. Await results. Treat as needed.   Relevant Orders   CBC with Differential/Platelet   Neoplasm of uncertain behavior of skin       Will get her in for shave biopsy. Await results.        Follow up plan: Return as able for shave biopsy.

## 2023-01-12 NOTE — Assessment & Plan Note (Signed)
Under good control on current regimen. Continue current regimen. Continue to monitor. Call with any concerns. Refills given.   

## 2023-01-12 NOTE — Assessment & Plan Note (Signed)
Would like to discuss trigger point injections. Would like to see pm&r- referral generated today.

## 2023-01-12 NOTE — Addendum Note (Signed)
Addended by: Valerie Roys on: 01/12/2023 11:55 AM   Modules accepted: Orders

## 2023-01-12 NOTE — Assessment & Plan Note (Signed)
Still not doing great. Continue to follow with psychiatry. Call with any concerns.

## 2023-01-13 LAB — BASIC METABOLIC PANEL
BUN/Creatinine Ratio: 27 (ref 12–28)
BUN: 18 mg/dL (ref 8–27)
CO2: 21 mmol/L (ref 20–29)
Calcium: 9.5 mg/dL (ref 8.7–10.3)
Chloride: 101 mmol/L (ref 96–106)
Creatinine, Ser: 0.67 mg/dL (ref 0.57–1.00)
Glucose: 89 mg/dL (ref 70–99)
Potassium: 4.5 mmol/L (ref 3.5–5.2)
Sodium: 139 mmol/L (ref 134–144)
eGFR: 99 mL/min/{1.73_m2} (ref >59)

## 2023-01-13 LAB — CBC WITH DIFFERENTIAL/PLATELET
Basophils Absolute: 0.1 10*3/uL (ref 0.0–0.2)
Basos: 1 %
EOS (ABSOLUTE): 0.1 10*3/uL (ref 0.0–0.4)
Eos: 1 %
Hematocrit: 35.5 % (ref 34.0–46.6)
Hemoglobin: 11.9 g/dL (ref 11.1–15.9)
Immature Grans (Abs): 0 10*3/uL (ref 0.0–0.1)
Immature Granulocytes: 0 %
Lymphocytes Absolute: 2.2 10*3/uL (ref 0.7–3.1)
Lymphs: 24 %
MCH: 29.5 pg (ref 26.6–33.0)
MCHC: 33.5 g/dL (ref 31.5–35.7)
MCV: 88 fL (ref 79–97)
Monocytes Absolute: 0.8 10*3/uL (ref 0.1–0.9)
Monocytes: 8 %
Neutrophils Absolute: 6 10*3/uL (ref 1.4–7.0)
Neutrophils: 66 %
Platelets: 450 10*3/uL (ref 150–450)
RBC: 4.04 x10E6/uL (ref 3.77–5.28)
RDW: 12.1 % (ref 11.7–15.4)
WBC: 9.3 10*3/uL (ref 3.4–10.8)

## 2023-01-13 LAB — SPECIMEN STATUS REPORT

## 2023-01-14 ENCOUNTER — Encounter: Payer: Medicare Other | Admitting: Physical Therapy

## 2023-01-17 ENCOUNTER — Ambulatory Visit: Payer: Medicare Other | Admitting: Psychiatry

## 2023-01-17 ENCOUNTER — Ambulatory Visit: Payer: Medicare Other | Admitting: Physical Therapy

## 2023-01-20 ENCOUNTER — Ambulatory Visit: Payer: Medicare Other | Admitting: Family Medicine

## 2023-01-21 ENCOUNTER — Encounter: Payer: Medicare Other | Admitting: Physical Therapy

## 2023-01-24 ENCOUNTER — Ambulatory Visit: Payer: Medicare Other | Admitting: Physical Therapy

## 2023-01-24 ENCOUNTER — Encounter: Payer: Self-pay | Admitting: *Deleted

## 2023-01-31 ENCOUNTER — Ambulatory Visit: Payer: Medicare Other | Admitting: Physical Therapy

## 2023-02-01 ENCOUNTER — Other Ambulatory Visit: Payer: Self-pay | Admitting: Nurse Practitioner

## 2023-02-01 ENCOUNTER — Other Ambulatory Visit: Payer: Self-pay | Admitting: Family Medicine

## 2023-02-01 DIAGNOSIS — M5416 Radiculopathy, lumbar region: Secondary | ICD-10-CM

## 2023-02-01 NOTE — Telephone Encounter (Signed)
Pt is calling stating the pharmacy sent over the wrong prescription dosage. It should be eszopiclone (LUNESTA) 2 MG TABS tablet [id number]    Pt states she is out of medication and is needing her refill before she leaves out of town.    [facility] [id number], [place] AT [place] Phone: [phone]  Fax: [phone]

## 2023-02-02 NOTE — Telephone Encounter (Signed)
Unable to refill per protocol, Rx expired. Medication discontinued 01/11/23.  Requested Prescriptions  Pending Prescriptions Disp Refills   eszopiclone (LUNESTA) 1 MG TABS tablet [Pharmacy Med Name: ESZOPICLONE '1MG'$  TABLETS] 30 tablet     Sig: TAKE 1 TABLET(1 MG) BY MOUTH AT BEDTIME AS NEEDED FOR SLEEP TAKE IMMEDIATELY BEFORE BEDTIME     Not Delegated - Psychiatry:  Anxiolytics/Hypnotics Failed - 02/01/2023  2:47 PM      Failed - This refill cannot be delegated      Failed - Urine Drug Screen completed in last 360 days      Passed - Valid encounter within last 6 months    Recent Outpatient Visits           3 weeks ago Benign essential hypertension   [place], [place], DO   1 month ago Benign essential hypertension   [place], [place], DO   2 months ago Chronic pain syndrome   [place], [place] P, DO   3 months ago Chronic pain syndrome   [place], DO   3 months ago Benign essential hypertension   [place], [place], DO

## 2023-02-04 ENCOUNTER — Ambulatory Visit: Payer: Medicare Other | Admitting: Family Medicine

## 2023-02-04 ENCOUNTER — Encounter: Payer: Medicare Other | Admitting: Physical Therapy

## 2023-02-07 ENCOUNTER — Ambulatory Visit: Payer: Medicare Other | Admitting: Physical Therapy

## 2023-02-08 ENCOUNTER — Encounter: Payer: Self-pay | Admitting: Emergency Medicine

## 2023-02-08 ENCOUNTER — Ambulatory Visit
Admission: EM | Admit: 2023-02-08 | Discharge: 2023-02-08 | Disposition: A | Discharge status: Home / Self Care | Payer: Medicare Other | Attending: Emergency Medicine | Admitting: Emergency Medicine

## 2023-02-08 DIAGNOSIS — K760 Fatty (change of) liver, not elsewhere classified: Secondary | ICD-10-CM | POA: Diagnosis not present

## 2023-02-08 DIAGNOSIS — J45901 Unspecified asthma with (acute) exacerbation: Secondary | ICD-10-CM | POA: Diagnosis not present

## 2023-02-08 DIAGNOSIS — M797 Fibromyalgia: Secondary | ICD-10-CM | POA: Diagnosis not present

## 2023-02-08 DIAGNOSIS — I10 Essential (primary) hypertension: Secondary | ICD-10-CM | POA: Diagnosis not present

## 2023-02-08 DIAGNOSIS — G894 Chronic pain syndrome: Secondary | ICD-10-CM | POA: Insufficient documentation

## 2023-02-08 DIAGNOSIS — J029 Acute pharyngitis, unspecified: Secondary | ICD-10-CM | POA: Diagnosis present

## 2023-02-08 DIAGNOSIS — Z1152 Encounter for screening for COVID-19: Secondary | ICD-10-CM | POA: Diagnosis not present

## 2023-02-08 DIAGNOSIS — J209 Acute bronchitis, unspecified: Secondary | ICD-10-CM | POA: Diagnosis not present

## 2023-02-08 MED ORDER — AZITHROMYCIN 250 MG PO TABS: 250.0000 mg | ORAL_TABLET | Freq: Every day | ORAL | 0 refills | Status: DC

## 2023-02-08 MED ORDER — PREDNISONE 10 MG PO TABS: 40.0000 mg | ORAL_TABLET | Freq: Every day | ORAL | 0 refills | Status: AC

## 2023-02-08 MED ORDER — ALBUTEROL SULFATE HFA 108 (90 BASE) MCG/ACT IN AERS: 1.0000 | INHALATION_SPRAY | Freq: Four times a day (QID) | RESPIRATORY_TRACT | 0 refills | Status: DC | PRN

## 2023-02-08 NOTE — Discharge Instructions (Addendum)
Use the albuterol inhaler and take the prednisone as directed.  If your symptoms are not improving, start the Zithromax.    Your COVID test is pending.    Your blood pressure is elevated today at 154/84; recheck 160/89.  Please have this rechecked by your primary care provider in 2-4 weeks.

## 2023-02-08 NOTE — ED Triage Notes (Signed)
Provider triage  
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
## 2023-02-09 LAB — SARS CORONAVIRUS 2 (TAT 6-24 HRS): SARS Coronavirus 2: NEGATIVE

## 2023-02-11 ENCOUNTER — Encounter: Payer: Medicare Other | Admitting: Physical Therapy

## 2023-02-14 ENCOUNTER — Ambulatory Visit: Payer: Medicare Other | Admitting: Physical Therapy

## 2023-02-18 ENCOUNTER — Encounter: Payer: Medicare Other | Admitting: Physical Therapy

## 2023-02-24 ENCOUNTER — Ambulatory Visit: Payer: Medicare Other | Admitting: Gastroenterology

## 2023-02-24 ENCOUNTER — Encounter: Payer: Self-pay | Admitting: Gastroenterology

## 2023-02-24 NOTE — Progress Notes (Deleted)
Gastroenterology Consultation  Referring Provider:     Valerie Roys, DO Primary Care Physician:  Valerie Roys, DO Primary Gastroenterologist:  Dr. Allen Norris     Reason for Consultation:     GERD        HPI:   Kaitlyn Frazier is a 62 y.o. y/o female referred for consultation & management of GERD by Dr. Wynetta Emery, Megan P, DO.  This patient comes in today with a history of being sent for consultation due to GERD.  It appears that the patient had been contacted by our office back in June of last year with multiple messages left for the patient to set up a screening colonoscopy.  It does not appear that the patient ended up having that procedure done.  At the patient's last office visit with her PCP in November it was reported that the patient had reflux under good control and refills were given of her medication.   Past Medical History:  Diagnosis Date   Abnormal vaginal Pap smear    Depression    Fibromyalgia    hx of Conversion disorder    hx of Peritonitis (HCC)    Mitral valve regurgitation    Sleep apnea    Thyroid nodule     Past Surgical History:  Procedure Laterality Date   ABDOMINAL HYSTERECTOMY     ANTERIOR / POSTERIOR COMBINED FUSION CERVICAL SPINE     ANTERIOR LAT LUMBAR FUSION     BREAST BIOPSY N/A    pt unsure which breast 10 years ago-fibrous tissue   CESAREAN SECTION     INGUINAL HERNIA REPAIR     NOSE SURGERY     TUMOR REMOVAL     on her SI joint    Prior to Admission medications   Medication Sig Start Date End Date Taking? Authorizing Provider  albuterol (VENTOLIN HFA) 108 (90 Base) MCG/ACT inhaler Inhale into the lungs every 6 (six) hours as needed for wheezing or shortness of breath.    [provider]  albuterol (VENTOLIN HFA) 108 (90 Base) MCG/ACT inhaler Inhale 1-2 puffs into the lungs every 6 (six) hours as needed. 02/08/23   Sharion Balloon, NP  azithromycin (ZITHROMAX) 250 MG tablet Take 1 tablet (250 mg total) by mouth daily. Take first 2  tablets together, then 1 every day until finished. 02/08/23   Sharion Balloon, NP  buPROPion [facility] SR) 150 MG 12 hr tablet Take 150 mg by mouth daily.    [provider]  Cholecalciferol (VITAMIN D-3) 25 MCG (1000 UT) CAPS Take by mouth.    [provider]  diclofenac (VOLTAREN) 50 MG EC tablet 1 tablet as needed Orally Twice a day for 30 days 01/04/23   [provider]  DULoxetine (CYMBALTA) 60 MG capsule Take 1 capsule (60 mg total) by mouth daily. 11/24/22   Ursula Alert, MD  eszopiclone (LUNESTA) 2 MG TABS tablet Take 1 tablet (2 mg total) by mouth at bedtime as needed for sleep. Take immediately before bedtime 01/12/23   Park Liter P, DO  hydrochlorothiazide (HYDRODIURIL) 25 MG tablet Take 1 tablet (25 mg total) by mouth daily. 12/23/22   Johnson, Megan P, DO  hydrOXYzine (ATARAX) 50 MG tablet Take 0.5-1 tablets (25-50 mg total) by mouth at bedtime. 10/20/22   Johnson, Megan P, DO  methocarbamol (ROBAXIN) 500 MG tablet Take 1 tablet (500 mg total) by mouth 3 (three) times daily. 11/25/22   Johnson, Megan P, DO  metroNIDAZOLE (METROGEL) 1 %  gel Apply topically daily. 10/13/22   Johnson, Megan P, DO  omeprazole (PRILOSEC) 40 MG capsule Take 1 capsule (40 mg total) by mouth daily. 12/23/22   Johnson, Megan P, DO  prednisoLONE acetate (PRED FORTE) 1 % ophthalmic suspension Place 1 drop into the right eye 4 (four) times daily. 12/10/22   [provider]  pregabalin (LYRICA) 150 MG capsule Take 1 capsule (150 mg total) by mouth daily. 11/25/22   Valerie Roys, DO  traMADol (ULTRAM) 50 MG tablet 1 tablet as needed Orally once daily as needed for severe pain only for 15 days 01/04/23   [provider]    Family History  Problem Relation Age of Onset   Alcohol abuse Mother    Bipolar disorder Mother    Uterine cancer Paternal Aunt        50s   Ovarian cancer Paternal Aunt        71s   Ovarian cancer Paternal Aunt        84s   Ovarian cancer Cousin         41s   Tourette syndrome Son      Social History   Tobacco Use   Smoking status: Never    Passive exposure: Never   Smokeless tobacco: Never  Vaping Use   Vaping Use: Some days   Substances: Nicotine  Substance Use Topics   Alcohol use: Never   Drug use: Never    Allergies as of 02/24/2023 - Review Complete 02/08/2023  Allergen Reaction Noted   Gabapentin Other (See Comments), Hives, and Rash 04/16/2022   Amitriptyline Other (See Comments) 04/16/2022   Baclofen Other (See Comments) 04/16/2022   Cyclobenzaprine Other (See Comments) 04/16/2022   Doxepin  11/24/2022   Latex Hives 04/16/2022   Norflex tablets [orphenadrine]  05/24/2022   Pollen extract Itching 04/16/2022   Tizanidine Other (See Comments) 08/04/2022   Other Rash 04/16/2022    Review of Systems:    All systems reviewed and negative except where noted in HPI.   Physical Exam:  There were no vitals taken for this visit. No LMP recorded. Patient has had a hysterectomy. General:   Alert,  Well-developed, well-nourished, pleasant and cooperative in NAD Head:  Normocephalic and atraumatic. Eyes:  Sclera clear, no icterus.   Conjunctiva pink. Ears:  Normal auditory acuity. Neck:  Supple; no masses or thyromegaly. Lungs:  Respirations even and unlabored.  Clear throughout to auscultation.   No wheezes, crackles, or rhonchi. No acute distress. Heart:  Regular rate and rhythm; no murmurs, clicks, rubs, or gallops. Abdomen:  Normal bowel sounds.  No bruits.  Soft, non-tender and non-distended without masses, hepatosplenomegaly or hernias noted.  No guarding or rebound tenderness.  Negative Carnett sign.   Rectal:  Deferred.  Pulses:  Normal pulses noted. Extremities:  No clubbing or edema.  No cyanosis. Neurologic:  Alert and oriented x3;  grossly normal neurologically. Skin:  Intact without significant lesions or rashes.  No jaundice. Lymph Nodes:  No significant cervical adenopathy. Psych:  Alert and  cooperative. Normal mood and affect.  Imaging Studies: No results found.  Assessment and Plan:   Kaitlyn Frazier is a 62 y.o. y/o female ***    Kaitlyn Lame, MD. Marval Regal    Note: This dictation was prepared with Dragon dictation along with smaller phrase technology. Any transcriptional errors that result from this process are unintentional.

## 2023-02-25 ENCOUNTER — Encounter: Payer: Medicare Other | Admitting: Physical Therapy

## 2023-03-01 ENCOUNTER — Encounter: Payer: Medicare Other | Admitting: Physical Therapy

## 2023-03-03 ENCOUNTER — Encounter: Payer: Self-pay | Admitting: Family Medicine

## 2023-03-03 ENCOUNTER — Ambulatory Visit: Payer: Medicare Other | Admitting: Family Medicine

## 2023-03-03 VITALS — BP 140/76 | HR 94 | Temp 98.8°F | Ht 63.0 in | Wt 189.3 lb

## 2023-03-03 DIAGNOSIS — M25561 Pain in right knee: Secondary | ICD-10-CM

## 2023-03-03 DIAGNOSIS — G4700 Insomnia, unspecified: Secondary | ICD-10-CM

## 2023-03-03 NOTE — Progress Notes (Signed)
BP (!) 140/76   Pulse 94   Temp 98.8 F (37.1 C) (Oral)   Ht 5\' 3"  (1.6 m)   Wt 189 lb 4.8 oz (85.9 kg)   SpO2 97%   BMI 33.53 kg/m    Subjective:    Patient ID: Kaitlyn Frazier, female    DOB: 04-05-1961, 62 y.o.   MRN: [id number]  HPI: Kaitlyn Frazier is a 62 y.o. female  Chief Complaint  Patient presents with   Forms    Patient says she has forms to discuss with provider about accommodations for work.    Sleeping Problem   INSOMNIA- has been making careless errors in the AM while she is at work days that she takes her medicine Duration: chronic Satisfied with sleep quality: no Difficulty falling asleep: no Difficulty staying asleep: yes Waking a few hours after sleep onset: yes Early morning awakenings: yes Daytime hypersomnolence: no Wakes feeling refreshed: no Good sleep hygiene: yes Apnea: no Snoring: no Depressed/anxious mood: yes Recent stress: yes Restless legs/nocturnal leg cramps: no Chronic pain/arthritis: yes History of sleep study: yes Treatments attempted:  lunesta, melatonin, uinsom, benadryl, and ambien   KNEE PAIN Duration:  over a month Involved knee: left Mechanism of injury:  tweeked it Location:lateral Onset: sudden Severity: moderate  Quality:  aching Frequency: intermittent Radiation: no Aggravating factors: movement  Alleviating factors: rest  Status: worse Treatments attempted: rest, voltaren  Relief with NSAIDs?:  mild Weakness with weight bearing or walking: yes Sensation of giving way: no Locking: no Popping: no Bruising: no Swelling: no Redness: no Paresthesias/decreased sensation: no Fevers: no  Relevant past medical, surgical, family and social history reviewed and updated as indicated. Interim medical history since our last visit reviewed. Allergies and medications reviewed and updated.  Review of Systems  Constitutional: Negative.   Respiratory: Negative.    Cardiovascular: Negative.   Gastrointestinal:  Negative.   Musculoskeletal: Negative.   Psychiatric/Behavioral:  Positive for dysphoric mood and sleep disturbance. Negative for agitation, behavioral problems, confusion, decreased concentration, hallucinations, self-injury and suicidal ideas. The patient is nervous/anxious. The patient is not hyperactive.     Per HPI unless specifically indicated above     Objective:    BP (!) 140/76   Pulse 94   Temp 98.8 F (37.1 C) (Oral)   Ht 5\' 3"  (1.6 m)   Wt 189 lb 4.8 oz (85.9 kg)   SpO2 97%   BMI 33.53 kg/m   Wt Readings from Last 3 Encounters:  03/03/23 189 lb 4.8 oz (85.9 kg)  01/12/23 194 lb 6.4 oz (88.2 kg)  12/23/22 190 lb 11.2 oz (86.5 kg)    Physical Exam Vitals and nursing note reviewed.  Constitutional:      General: She is not in acute distress.    Appearance: Normal appearance. She is not ill-appearing, toxic-appearing or diaphoretic.  HENT:     Head: Normocephalic and atraumatic.     Right Ear: External ear normal.     Left Ear: External ear normal.     Nose: Nose normal.     Mouth/Throat:     Mouth: Mucous membranes are moist.     Pharynx: Oropharynx is clear.  Eyes:     General: No scleral icterus.       Right eye: No discharge.        Left eye: No discharge.     Extraocular Movements: Extraocular movements intact.     Conjunctiva/sclera: Conjunctivae normal.     Pupils: Pupils are equal, round,  and reactive to light.  Cardiovascular:     Rate and Rhythm: Normal rate and regular rhythm.     Pulses: Normal pulses.     Heart sounds: Normal heart sounds. No murmur heard.    No friction rub. No gallop.  Pulmonary:     Effort: Pulmonary effort is normal. No respiratory distress.     Breath sounds: Normal breath sounds. No stridor. No wheezing, rhonchi or rales.  Chest:     Chest wall: No tenderness.  Musculoskeletal:        General: Normal range of motion.     Cervical back: Normal range of motion and neck supple.  Skin:    General: Skin is warm and dry.      Capillary Refill: Capillary refill takes less than 2 seconds.     Coloration: Skin is not jaundiced or pale.     Findings: No bruising, erythema, lesion or rash.  Neurological:     General: No focal deficit present.     Mental Status: She is alert and oriented to person, place, and time. Mental status is at baseline.  Psychiatric:        Mood and Affect: Mood normal.        Behavior: Behavior normal.        Thought Content: Thought content normal.        Judgment: Judgment normal.     Results for orders placed or performed during the hospital encounter of 02/08/23  SARS CORONAVIRUS 2 (TAT 6-24 HRS) Anterior Nasal Swab   Specimen: Anterior Nasal Swab  Result Value Ref Range   SARS Coronavirus 2 NEGATIVE NEGATIVE      Assessment & Plan:   Problem List Items Addressed This Visit       Other   Insomnia    Under good control on current regimen. Continue current regimen. Continue to monitor. Call with any concerns. Refills up to date. Will get form filled out so that she can work 2nd shift at [employer] to avoid mistakes due to her lunesta.         Other Visit Diagnoses     Acute pain of right knee    -  Primary   Will check x-ray and start stretches, if not getting better, will get her into PT.   Relevant Orders   DG Knee Complete 4 Views Right        Follow up plan: Return for As able for shave biopsy.

## 2023-03-03 NOTE — Assessment & Plan Note (Signed)
Under good control on current regimen. Continue current regimen. Continue to monitor. Call with any concerns. Refills up to date. Will get form filled out so that she can work 2nd shift at [employer] to avoid mistakes due to her lunesta.

## 2023-03-07 ENCOUNTER — Ambulatory Visit: Payer: Medicare Other | Admitting: Family Medicine

## 2023-03-07 ENCOUNTER — Encounter: Payer: Medicare Other | Admitting: Physical Therapy

## 2023-03-09 ENCOUNTER — Encounter: Payer: Medicare Other | Admitting: Physical Therapy

## 2023-03-14 ENCOUNTER — Encounter: Payer: Medicare Other | Admitting: Physical Therapy

## 2023-03-16 ENCOUNTER — Encounter: Payer: Medicare Other | Admitting: Physical Therapy

## 2023-03-21 ENCOUNTER — Telehealth: Payer: Self-pay | Admitting: Family Medicine

## 2023-03-21 ENCOUNTER — Encounter: Payer: Medicare Other | Admitting: Physical Therapy

## 2023-03-21 NOTE — Telephone Encounter (Signed)
Contacted Jhania Trenchard to schedule their annual wellness visit. Appointment made for 04/11/2023.  Verlee Rossetti; Care Guide Ambulatory Clinical Support [place] [facility] Direct Dial: [phone]

## 2023-03-23 ENCOUNTER — Encounter: Payer: Medicare Other | Admitting: Physical Therapy

## 2023-03-25 ENCOUNTER — Encounter: Payer: Self-pay | Admitting: Family Medicine

## 2023-03-25 ENCOUNTER — Ambulatory Visit: Payer: Medicare Other | Admitting: Family Medicine

## 2023-03-25 ENCOUNTER — Other Ambulatory Visit (HOSPITAL_COMMUNITY)
Admission: RE | Admit: 2023-03-25 | Discharge: 2023-03-25 | Disposition: A | Discharge status: Home / Self Care | Payer: Medicare Other | Source: Ambulatory Visit | Attending: Family Medicine | Admitting: Family Medicine

## 2023-03-25 VITALS — BP 149/84 | HR 91 | Temp 98.5°F | Ht 63.0 in | Wt 193.7 lb

## 2023-03-25 DIAGNOSIS — D485 Neoplasm of uncertain behavior of skin: Secondary | ICD-10-CM

## 2023-03-25 NOTE — Progress Notes (Signed)
BP (!) 149/84   Pulse 91   Temp 98.5 F (36.9 C)   Ht  (1.6 m)   Wt 193 lb 11.2 oz (87.9 kg)   SpO2 97%   BMI 34.31 kg/m    Subjective:    Patient ID: Kaitlyn Frazier, female    DOB: Dec 16, 1960, 62 y.o.   MRN: [id number]  HPI: Kaitlyn Frazier is a 62 y.o. female  Chief Complaint  Patient presents with   Biopsy    shave   SKIN LESION Duration: months Location: L side Painful: no Itching: no Onset: gradual Context: bigger  Relevant past medical, surgical, family and social history reviewed and updated as indicated. Interim medical history since our last visit reviewed. Allergies and medications reviewed and updated.  Review of Systems  Constitutional: Negative.   Respiratory: Negative.    Cardiovascular: Negative.   Gastrointestinal: Negative.   Musculoskeletal: Negative.   Skin:  Positive for color change. Negative for pallor, rash and wound.  Psychiatric/Behavioral: Negative.      Per HPI unless specifically indicated above     Objective:    BP (!) 149/84   Pulse 91   Temp 98.5 F (36.9 C)   Ht  (1.6 m)   Wt 193 lb 11.2 oz (87.9 kg)   SpO2 97%   BMI 34.31 kg/m   Wt Readings from Last 3 Encounters:  03/25/23 193 lb 11.2 oz (87.9 kg)  03/03/23 189 lb 4.8 oz (85.9 kg)  01/12/23 194 lb 6.4 oz (88.2 kg)    Physical Exam Vitals and nursing note reviewed.  Constitutional:      General: She is not in acute distress.    Appearance: Normal appearance. She is not ill-appearing, toxic-appearing or diaphoretic.  HENT:     Head: Normocephalic and atraumatic.     Right Ear: External ear normal.     Left Ear: External ear normal.     Nose: Nose normal.     Mouth/Throat:     Mouth: Mucous membranes are moist.     Pharynx: Oropharynx is clear.  Eyes:     General: No scleral icterus.       Right eye: No discharge.        Left eye: No discharge.     Extraocular Movements: Extraocular movements intact.     Conjunctiva/sclera: Conjunctivae  normal.     Pupils: Pupils are equal, round, and reactive to light.  Cardiovascular:     Rate and Rhythm: Normal rate and regular rhythm.     Pulses: Normal pulses.     Heart sounds: Normal heart sounds. No murmur heard.    No friction rub. No gallop.  Pulmonary:     Effort: Pulmonary effort is normal. No respiratory distress.     Breath sounds: Normal breath sounds. No stridor. No wheezing, rhonchi or rales.  Chest:     Chest wall: No tenderness.  Musculoskeletal:        General: Normal range of motion.     Cervical back: Normal range of motion and neck supple.  Skin:    General: Skin is warm and dry.     Capillary Refill: Capillary refill takes less than 2 seconds.     Coloration: Skin is not jaundiced or pale.     Findings: No bruising, erythema, lesion or rash.     Comments: 1.5cm stuck on hyperpigemented lesion on L side with areas of darker change within  Neurological:     General: No focal deficit  present.     Mental Status: She is alert and oriented to person, place, and time. Mental status is at baseline.  Psychiatric:        Mood and Affect: Mood normal.        Behavior: Behavior normal.        Thought Content: Thought content normal.        Judgment: Judgment normal.     Results for orders placed or performed during the hospital encounter of 02/08/23  SARS CORONAVIRUS 2 (TAT 6-24 HRS) Anterior Nasal Swab   Specimen: Anterior Nasal Swab  Result Value Ref Range   SARS Coronavirus 2 NEGATIVE NEGATIVE      Assessment & Plan:   Problem List Items Addressed This Visit   None Visit Diagnoses     Neoplasm of uncertain behavior of skin    -  Primary   ?SK but with hyperpigmentation, will send for pathology. Removed today as below. Call with any concerns.   Relevant Orders   Surgical pathology       Skin Procedure  Procedure: Informed consent given.  Sterile prep of the area.  Area infiltrated with lidocaine without epinephrine.  Using a surgical blade, part of  the upper dermis shaved off and sent  for pathology.  Area cauterized. Pt ed on scarring.  Diagnosis:   ICD-10-CM   1. Neoplasm of uncertain behavior of skin  D48.5 Surgical pathology   ?SK but with hyperpigmentation, will send for pathology. Removed today as below. Call with any concerns.      Lesion Location/Size: 1.5cm stuck on hyperpigemented lesion on L side with areas of darker change within Physician: MJ Consent:  Risks, benefits, and alternative treatments discussed and all questions were answered.  Patient elected to proceed and verbal consent obtained.  Description: Area prepped and draped using semi-sterile technique. Area locally anesthetized using 3 cc's of lidocaine 1% plain. Shave biopsy of lesion performed using a dermablade.  Adequate hemostastis achieved using Silver Nitrate. Wound dressed after application of bacitracin ointment.  Post Procedure Instructions: Wound care instructions discussed and patient was instructed to keep area clean and dry.  Signs and symptoms of infection discussed, patient agrees to contact the office ASAP should they occur.  Dressing change recommended  Follow up plan: Return if symptoms worsen or fail to improve.

## 2023-03-28 ENCOUNTER — Encounter: Payer: Medicare Other | Admitting: Physical Therapy

## 2023-03-28 ENCOUNTER — Telehealth: Payer: Self-pay | Admitting: Family Medicine

## 2023-03-28 NOTE — Telephone Encounter (Signed)
L Flank/Side- under axilla, midaxillary... I'm not sure how they want me to describe it- it was on her side

## 2023-03-28 NOTE — Telephone Encounter (Signed)
Reason for CRM:  Neysa Bonito from [facility] pathology says received scan order and needs more info of site. It says biopsy of left side, but not of what. Please call back

## 2023-03-29 NOTE — Telephone Encounter (Signed)
[facility] department was made aware of Dr Henriette Combs recommendations. Technician verbalized understanding and has no further questions at this time.

## 2023-03-30 ENCOUNTER — Encounter: Payer: Medicare Other | Admitting: Physical Therapy

## 2023-03-30 LAB — SURGICAL PATHOLOGY

## 2023-04-04 ENCOUNTER — Encounter: Payer: Medicare Other | Admitting: Physical Therapy

## 2023-04-06 ENCOUNTER — Encounter: Payer: Medicare Other | Admitting: Physical Therapy

## 2023-04-11 ENCOUNTER — Ambulatory Visit (INDEPENDENT_AMBULATORY_CARE_PROVIDER_SITE_OTHER): Payer: Medicare Other

## 2023-04-11 VITALS — Wt 193.0 lb

## 2023-04-11 DIAGNOSIS — Z0000 Encounter for general adult medical examination without abnormal findings: Secondary | ICD-10-CM | POA: Diagnosis not present

## 2023-04-11 DIAGNOSIS — Z1211 Encounter for screening for malignant neoplasm of colon: Secondary | ICD-10-CM

## 2023-04-11 NOTE — Patient Instructions (Signed)
[name] , Thank you for taking time to come for your Medicare Wellness Visit. I appreciate your ongoing commitment to your health goals. Please review the following plan we discussed and let me know if I can assist you in the future.   These are the goals we discussed:  Goals      DIET - EAT MORE FRUITS AND VEGETABLES        This is a list of the screening recommended for you and due dates:  Health Maintenance  Topic Date Due   COVID-19 Vaccine (1) Never done   HIV Screening  Never done   Hepatitis C Screening: USPSTF Recommendation to screen - Ages 64-79 yo.  Never done   DTaP/Tdap/Td vaccine (1 - Tdap) Never done   Zoster (Shingles) Vaccine (1 of 2) Never done   Colon Cancer Screening  Never done   Flu Shot  07/07/2023   Medicare Annual Wellness Visit  04/10/2024   Mammogram  11/19/2024   Pap Smear  05/24/2025   HPV Vaccine  Aged Out    Advanced directives: no  Conditions/risks identified: none  Next appointment: Follow up in one year for your annual wellness visit. 04/16/24 @ 11:15 am by phone  Preventive Care 40-64 Years, Female Preventive care refers to lifestyle choices and visits with your health care provider that can promote health and wellness. What does preventive care include? A yearly physical exam. This is also called an annual well check. Dental exams once or twice a year. Routine eye exams. Ask your health care provider how often you should have your eyes checked. Personal lifestyle choices, including: Daily care of your teeth and gums. Regular physical activity. Eating a healthy diet. Avoiding tobacco and drug use. Limiting alcohol use. Practicing safe sex. Taking low-dose aspirin daily starting at age 85. Taking vitamin and mineral supplements as recommended by your health care provider. What happens during an annual well check? The services and screenings done by your health care provider during your annual well check will depend on your age,  overall health, lifestyle risk factors, and family history of disease. Counseling  Your health care provider may ask you questions about your: Alcohol use. Tobacco use. Drug use. Emotional well-being. Home and relationship well-being. Sexual activity. Eating habits. Work and work Astronomer. Method of birth control. Menstrual cycle. Pregnancy history. Screening  You may have the following tests or measurements: Height, weight, and BMI. Blood pressure. Lipid and cholesterol levels. These may be checked every 5 years, or more frequently if you are over 34 years old. Skin check. Lung cancer screening. You may have this screening every year starting at age 60 if you have a 30-pack-year history of smoking and currently smoke or have quit within the past 15 years. Fecal occult blood test (FOBT) of the stool. You may have this test every year starting at age 27. Flexible sigmoidoscopy or colonoscopy. You may have a sigmoidoscopy every 5 years or a colonoscopy every 10 years starting at age 37. Hepatitis C blood test. Hepatitis B blood test. Sexually transmitted disease (STD) testing. Diabetes screening. This is done by checking your blood sugar (glucose) after you have not eaten for a while (fasting). You may have this done every 1-3 years. Mammogram. This may be done every 1-2 years. Talk to your health care provider about when you should start having regular mammograms. This may depend on whether you have a family history of breast cancer. BRCA-related cancer screening. This may be done if you  have a family history of breast, ovarian, tubal, or peritoneal cancers. Pelvic exam and Pap test. This may be done every 3 years starting at age 28. Starting at age 25, this may be done every 5 years if you have a Pap test in combination with an HPV test. Bone density scan. This is done to screen for osteoporosis. You may have this scan if you are at high risk for osteoporosis. Discuss your test  results, treatment options, and if necessary, the need for more tests with your health care provider. Vaccines  Your health care provider may recommend certain vaccines, such as: Influenza vaccine. This is recommended every year. Tetanus, diphtheria, and acellular pertussis (Tdap, Td) vaccine. You may need a Td booster every 10 years. Zoster vaccine. You may need this after age 18. Pneumococcal 13-valent conjugate (PCV13) vaccine. You may need this if you have certain conditions and were not previously vaccinated. Pneumococcal polysaccharide (PPSV23) vaccine. You may need one or two doses if you smoke cigarettes or if you have certain conditions. Talk to your health care provider about which screenings and vaccines you need and how often you need them. This information is not intended to replace advice given to you by your health care provider. Make sure you discuss any questions you have with your health care provider. Document Released: 12/19/2015 Document Revised: 08/11/2016 Document Reviewed: 09/23/2015 Elsevier Interactive Patient Education  2017 [employer].    Fall Prevention in the Home Falls can cause injuries. They can happen to people of all ages. There are many things you can do to make your home safe and to help prevent falls. What can I do on the outside of my home? Regularly fix the edges of walkways and driveways and fix any cracks. Remove anything that might make you trip as you walk through a door, such as a raised step or threshold. Trim any bushes or trees on the path to your home. Use bright outdoor lighting. Clear any walking paths of anything that might make someone trip, such as rocks or tools. Regularly check to see if handrails are loose or broken. Make sure that both sides of any steps have handrails. Any raised decks and porches should have guardrails on the edges. Have any leaves, snow, or ice cleared regularly. Use sand or salt on walking paths during  winter. Clean up any spills in your garage right away. This includes oil or grease spills. What can I do in the bathroom? Use night lights. Install grab bars by the toilet and in the tub and shower. Do not use towel bars as grab bars. Use non-skid mats or decals in the tub or shower. If you need to sit down in the shower, use a plastic, non-slip stool. Keep the floor dry. Clean up any water that spills on the floor as soon as it happens. Remove soap buildup in the tub or shower regularly. Attach bath mats securely with double-sided non-slip rug tape. Do not have throw rugs and other things on the floor that can make you trip. What can I do in the bedroom? Use night lights. Make sure that you have a light by your bed that is easy to reach. Do not use any sheets or blankets that are too big for your bed. They should not hang down onto the floor. Have a firm chair that has side arms. You can use this for support while you get dressed. Do not have throw rugs and other things on the floor that  can make you trip. What can I do in the kitchen? Clean up any spills right away. Avoid walking on wet floors. Keep items that you use a lot in easy-to-reach places. If you need to reach something above you, use a strong step stool that has a grab bar. Keep electrical cords out of the way. Do not use floor polish or wax that makes floors slippery. If you must use wax, use non-skid floor wax. Do not have throw rugs and other things on the floor that can make you trip. What can I do with my stairs? Do not leave any items on the stairs. Make sure that there are handrails on both sides of the stairs and use them. Fix handrails that are broken or loose. Make sure that handrails are as long as the stairways. Check any carpeting to make sure that it is firmly attached to the stairs. Fix any carpet that is loose or worn. Avoid having throw rugs at the top or bottom of the stairs. If you do have throw rugs,  attach them to the floor with carpet tape. Make sure that you have a light switch at the top of the stairs and the bottom of the stairs. If you do not have them, ask someone to add them for you. What else can I do to help prevent falls? Wear shoes that: Do not have high heels. Have rubber bottoms. Are comfortable and fit you well. Are closed at the toe. Do not wear sandals. If you use a stepladder: Make sure that it is fully opened. Do not climb a closed stepladder. Make sure that both sides of the stepladder are locked into place. Ask someone to hold it for you, if possible. Clearly mark and make sure that you can see: Any grab bars or handrails. First and last steps. Where the edge of each step is. Use tools that help you move around (mobility aids) if they are needed. These include: Canes. Walkers. Scooters. Crutches. Turn on the lights when you go into a dark area. Replace any light bulbs as soon as they burn out. Set up your furniture so you have a clear path. Avoid moving your furniture around. If any of your floors are uneven, fix them. If there are any pets around you, be aware of where they are. Review your medicines with your doctor. Some medicines can make you feel dizzy. This can increase your chance of falling. Ask your doctor what other things that you can do to help prevent falls. This information is not intended to replace advice given to you by your health care provider. Make sure you discuss any questions you have with your health care provider. Document Released: 09/18/2009 Document Revised: 04/29/2016 Document Reviewed: 12/27/2014 Elsevier Interactive Patient Education  2017 [employer].

## 2023-04-11 NOTE — Progress Notes (Signed)
I connected with  Casondra Vanderhyde on 04/11/23 by a audio enabled telemedicine application and verified that I am speaking with the correct person using two identifiers.  Patient Location: Home  Provider Location: Office/Clinic  I discussed the limitations of evaluation and management by telemedicine. The patient expressed understanding and agreed to proceed.  Subjective:   Kaitlyn Frazier is a 62 y.o. female who presents for Medicare Annual (Subsequent) preventive examination.  Review of Systems     Cardiac Risk Factors include: advanced age (>39men, >29 women);hypertension     Objective:    Today's Vitals   04/11/23 1311  PainSc: 5    There is no height or weight on file to calculate BMI.     04/11/2023    1:19 PM 09/22/2022    4:17 PM 08/05/2022    1:57 PM 04/16/2022    1:30 PM  Advanced Directives  Does Patient Have a Medical Advance Directive? No No No No  Would patient like information on creating a medical advance directive? No - Patient declined No - Patient declined  No - Patient declined    Current Medications (verified) Outpatient Encounter Medications as of 04/11/2023  Medication Sig   albuterol (VENTOLIN HFA) 108 (90 Base) MCG/ACT inhaler Inhale 1-2 puffs into the lungs every 6 (six) hours as needed.   buPROPion (WELLBUTRIN SR) 150 MG 12 hr tablet Take 150 mg by mouth daily.   Cholecalciferol (VITAMIN D-3) 25 MCG (1000 UT) CAPS Take by mouth.   diclofenac (VOLTAREN) 50 MG EC tablet 1 tablet as needed Orally Twice a day for 30 days   DULoxetine (CYMBALTA) 60 MG capsule Take 1 capsule (60 mg total) by mouth daily.   eszopiclone (LUNESTA) 2 MG TABS tablet Take 1 tablet (2 mg total) by mouth at bedtime as needed for sleep. Take immediately before bedtime   hydrochlorothiazide (HYDRODIURIL) 25 MG tablet Take 1 tablet (25 mg total) by mouth daily.   hydrOXYzine (ATARAX) 50 MG tablet Take 0.5-1 tablets (25-50 mg total) by mouth at bedtime.   methocarbamol (ROBAXIN) 500  MG tablet Take 1 tablet (500 mg total) by mouth 3 (three) times daily.   metroNIDAZOLE (METROGEL) 1 % gel Apply topically daily.   omeprazole (PRILOSEC) 40 MG capsule Take 1 capsule (40 mg total) by mouth daily.   pregabalin (LYRICA) 150 MG capsule Take 1 capsule (150 mg total) by mouth daily.   [DISCONTINUED] albuterol (VENTOLIN HFA) 108 (90 Base) MCG/ACT inhaler Inhale into the lungs every 6 (six) hours as needed for wheezing or shortness of breath.   traMADol (ULTRAM) 50 MG tablet 50 mg 2 (two) times daily.   No facility-administered encounter medications on file as of 04/11/2023.    Allergies (verified) Gabapentin, Amitriptyline, Baclofen, Cyclobenzaprine, Doxepin, Latex, Norflex tablets [orphenadrine], Pollen extract, Tizanidine, and Other   History: Past Medical History:  Diagnosis Date   Abnormal vaginal Pap smear    Depression    Fibromyalgia    hx of Conversion disorder    hx of Peritonitis (HCC)    Mitral valve regurgitation    Sleep apnea    Thyroid nodule    Past Surgical History:  Procedure Laterality Date   ABDOMINAL HYSTERECTOMY     ANTERIOR / POSTERIOR COMBINED FUSION CERVICAL SPINE     ANTERIOR LAT LUMBAR FUSION     BREAST BIOPSY N/A    pt unsure which breast 10 years ago-fibrous tissue   CESAREAN SECTION     INGUINAL HERNIA REPAIR     NOSE  SURGERY     TUMOR REMOVAL     on her SI joint   Family History  Problem Relation Age of Onset   Alcohol abuse Mother    Bipolar disorder Mother    Uterine cancer Paternal Aunt        22s   Ovarian cancer Paternal Aunt        58s   Ovarian cancer Paternal Aunt        48s   Ovarian cancer Cousin        32s   Tourette syndrome Son    Social History   Socioeconomic History   Marital status: Married    Spouse name: Justin Mend   Number of children: 1   Years of education: Not on file   Highest education level: Master's degree (e.g., MA, MS, MEng, MEd, MSW, MBA)  Occupational History   Not on file  Tobacco Use    Smoking status: Never    Passive exposure: Never   Smokeless tobacco: Never  Vaping Use   Vaping Use: Some days   Substances: Nicotine  Substance and Sexual Activity   Alcohol use: Never   Drug use: Never   Sexual activity: Yes  Other Topics Concern   Not on file  Social History Narrative   Not on file   Social Determinants of Health   Financial Resource Strain: Low Risk  (04/11/2023)   Overall Financial Resource Strain (CARDIA)    Difficulty of Paying Living Expenses: Not hard at all  Food Insecurity: No Food Insecurity (04/11/2023)   Hunger Vital Sign    Worried About Running Out of Food in the Last Year: Never true    Ran Out of Food in the Last Year: Never true  Transportation Needs: No Transportation Needs (04/11/2023)   PRAPARE - Administrator, Civil Service (Medical): No    Lack of Transportation (Non-Medical): No  Physical Activity: Insufficiently Active (04/11/2023)   Exercise Vital Sign    Days of Exercise per Week: 3 days    Minutes of Exercise per Session: 30 min  Stress: No Stress Concern Present (04/11/2023)   [employer] of Occupational Health - Occupational Stress Questionnaire    Feeling of Stress : Only a little  Social Connections: Moderately Isolated (04/11/2023)   Social Connection and Isolation Panel [NHANES]    Frequency of Communication with Friends and Family: Once a week    Frequency of Social Gatherings with Friends and Family: Never    Attends Religious Services: Never    Database administrator or Organizations: Yes    Attends Engineer, structural: More than 4 times per year    Marital Status: Married    Tobacco Counseling Counseling given: Not Answered   Clinical Intake:  Pre-visit preparation completed: Yes  Pain : 0-10 Pain Score: 5  Pain Type: Chronic pain Pain Location: Neck Pain Radiating Towards: upper back     Nutritional Risks: None Diabetes: No  How often do you need to have someone help you when you  read instructions, pamphlets, or other written materials from your doctor or pharmacy?: 1 - Never  Diabetic?no  Interpreter Needed?: No  Information entered by :: Kennedy Bucker, LPN   Activities of Daily Living    04/11/2023    1:20 PM  In your present state of health, do you have any difficulty performing the following activities:  Hearing? 0  Vision? 0  Difficulty concentrating or making decisions? 0  Walking or climbing stairs?  1  Dressing or bathing? 0  Doing errands, shopping? 0  Preparing Food and eating ? N  Using the Toilet? N  In the past six months, have you accidently leaked urine? N  Do you have problems with loss of bowel control? N  Managing your Medications? N  Managing your Finances? N  Housekeeping or managing your Housekeeping? N    Patient Care Team: Dorcas Carrow, DO as PCP - General (Family Medicine)  Indicate any recent Medical Services you may have received from other than [facility] providers in the past year (date may be approximate).     Assessment:   This is a routine wellness examination for Kaitlyn Frazier.  Hearing/Vision screen Hearing Screening - Comments:: No aids Vision Screening - Comments:: Wears glasses- [facility]   Dietary issues and exercise activities discussed: Current Exercise Habits: Home exercise routine, Type of exercise: walking, Time (Minutes): 30, Frequency (Times/Week): 3, Weekly Exercise (Minutes/Week): 90, Intensity: Mild   Goals Addressed             This Visit's Progress    DIET - EAT MORE FRUITS AND VEGETABLES         Depression Screen    04/11/2023    1:15 PM 01/12/2023    8:12 AM 12/23/2022    3:01 PM 11/24/2022    4:33 PM 10/13/2022    2:51 PM 09/23/2022    8:34 AM 08/04/2022    1:32 PM  PHQ 2/9 Scores  PHQ - 2 Score 2 6 2  3     PHQ- 9 Score 4 19 11  13        Information is confidential and restricted. Go to Review Flowsheets to unlock data.    Fall Risk    04/11/2023    1:19 PM 12/23/2022    3:01 PM  10/13/2022   11:12 AM  Fall Risk   Falls in the past year? 1 1 1   Number falls in past yr: 0 0 0  Injury with Fall? 1 1 1   Risk for fall due to : History of fall(s) History of fall(s) History of fall(s)  Risk for fall due to: Comment fell at work    Follow up Falls prevention discussed;Falls evaluation completed Falls evaluation completed Falls evaluation completed    FALL RISK PREVENTION PERTAINING TO THE HOME:  Any stairs in or around the home? Yes  If so, are there any without handrails? No  Home free of loose throw rugs in walkways, pet beds, electrical cords, etc? Yes  Adequate lighting in your home to reduce risk of falls? Yes   ASSISTIVE DEVICES UTILIZED TO PREVENT FALLS:  Life alert? No  Use of a cane, walker or w/c? No  Grab bars in the bathroom? Yes  Shower chair or bench in shower? No  Elevated toilet seat or a handicapped toilet? No   Cognitive Function:        04/11/2023    1:26 PM  6CIT Screen  What Year? 0 points  What month? 0 points  What time? 0 points  Count back from 20 0 points  Months in reverse 0 points  Repeat phrase 0 points  Total Score 0 points    Immunizations Immunization History  Administered Date(s) Administered   Influenza,inj,Quad PF,6+ Mos 10/20/2022    TDAP status: Due, Education has been provided regarding the importance of this vaccine. Advised may receive this vaccine at local pharmacy or Health Dept. Aware to provide a copy of the vaccination record if obtained  from local pharmacy or Health Dept. Verbalized acceptance and understanding.  Flu Vaccine status: Up to date  Pneumococcal vaccine status: Declined,  Education has been provided regarding the importance of this vaccine but patient still declined. Advised may receive this vaccine at local pharmacy or Health Dept. Aware to provide a copy of the vaccination record if obtained from local pharmacy or Health Dept. Verbalized acceptance and understanding.   Covid-19 vaccine  status: Declined, Education has been provided regarding the importance of this vaccine but patient still declined. Advised may receive this vaccine at local pharmacy or Health Dept.or vaccine clinic. Aware to provide a copy of the vaccination record if obtained from local pharmacy or Health Dept. Verbalized acceptance and understanding.  Qualifies for Shingles Vaccine? Yes   Zostavax completed No   Shingrix Completed?: No.    Education has been provided regarding the importance of this vaccine. Patient has been advised to call insurance company to determine out of pocket expense if they have not yet received this vaccine. Advised may also receive vaccine at local pharmacy or Health Dept. Verbalized acceptance and understanding.  Screening Tests Health Maintenance  Topic Date Due   COVID-19 Vaccine (1) Never done   HIV Screening  Never done   Hepatitis C Screening  Never done   DTaP/Tdap/Td (1 - Tdap) Never done   Zoster Vaccines- Shingrix (1 of 2) Never done   COLONOSCOPY (Pts 45-55yrs Insurance coverage will need to be confirmed)  Never done   INFLUENZA VACCINE  07/07/2023   Medicare Annual Wellness (AWV)  04/10/2024   MAMMOGRAM  11/19/2024   PAP SMEAR-Modifier  05/24/2025   HPV VACCINES  Aged Out    Health Maintenance  Health Maintenance Due  Topic Date Due   COVID-19 Vaccine (1) Never done   HIV Screening  Never done   Hepatitis C Screening  Never done   DTaP/Tdap/Td (1 - Tdap) Never done   Zoster Vaccines- Shingrix (1 of 2) Never done   COLONOSCOPY (Pts 45-68yrs Insurance coverage will need to be confirmed)  Never done    Colorectal cancer screening: Referral to GI placed 04/11/23. Pt aware the office will call re: appt.  Mammogram status: Completed 11/19/22. Repeat every year   Lung Cancer Screening: (Low Dose CT Chest recommended if Age 31-80 years, 30 pack-year currently smoking OR have quit w/in 15years.) does not qualify.    Additional Screening:  Hepatitis C  Screening: does qualify; Completed no  Vision Screening: Recommended annual ophthalmology exams for early detection of glaucoma and other disorders of the eye. Is the patient up to date with their annual eye exam?  Yes  Who is the provider or what is the name of the office in which the patient attends annual eye exams? [facility] If pt is not established with a provider, would they like to be referred to a provider to establish care? No .   Dental Screening: Recommended annual dental exams for proper oral hygiene  Community Resource Referral / Chronic Care Management: CRR required this visit?  No   CCM required this visit?  No      Plan:     I have personally reviewed and noted the following in the patient's chart:   Medical and social history Use of alcohol, tobacco or illicit drugs  Current medications and supplements including opioid prescriptions. Patient is not currently taking opioid prescriptions. Functional ability and status Nutritional status Physical activity Advanced directives List of other physicians Hospitalizations, surgeries, and ER visits  in previous 12 months Vitals Screenings to include cognitive, depression, and falls Referrals and appointments  In addition, I have reviewed and discussed with patient certain preventive protocols, quality metrics, and best practice recommendations. A written personalized care plan for preventive services as well as general preventive health recommendations were provided to patient.     Hal Hope, LPN   12/11/1094   Nurse Notes: none

## 2023-04-13 ENCOUNTER — Ambulatory Visit: Payer: Medicare Other | Admitting: Family Medicine

## 2023-04-13 ENCOUNTER — Encounter: Payer: Self-pay | Admitting: Family Medicine

## 2023-04-13 VITALS — BP 137/82 | HR 85 | Temp 98.5°F | Ht 63.0 in | Wt 192.8 lb

## 2023-04-13 DIAGNOSIS — F4310 Post-traumatic stress disorder, unspecified: Secondary | ICD-10-CM

## 2023-04-13 DIAGNOSIS — G4700 Insomnia, unspecified: Secondary | ICD-10-CM

## 2023-04-13 MED ORDER — PREGABALIN 150 MG PO CAPS: 150.0000 mg | ORAL_CAPSULE | Freq: Every day | ORAL | 3 refills | Status: DC

## 2023-04-13 MED ORDER — DIAZEPAM 10 MG PO TABS: 10.0000 mg | ORAL_TABLET | Freq: Every evening | ORAL | 0 refills | Status: DC | PRN

## 2023-04-13 MED ORDER — DULOXETINE HCL 60 MG PO CPEP: 60.0000 mg | ORAL_CAPSULE | Freq: Every day | ORAL | 0 refills | Status: DC

## 2023-04-13 MED ORDER — BUPROPION HCL ER (SR) 150 MG PO TB12: 150.0000 mg | ORAL_TABLET | Freq: Two times a day (BID) | ORAL | 0 refills | Status: DC

## 2023-04-13 MED ORDER — BELSOMRA 10 MG PO TABS: 10.0000 mg | ORAL_TABLET | Freq: Every evening | ORAL | 1 refills | Status: DC | PRN

## 2023-04-13 MED ORDER — OMEPRAZOLE 20 MG PO CPDR: 20.0000 mg | DELAYED_RELEASE_CAPSULE | Freq: Every day | ORAL | 1 refills | Status: DC

## 2023-04-13 NOTE — Progress Notes (Unsigned)
BP 137/82   Pulse 85   Temp 98.5 F (36.9 C) (Oral)   Ht 5\' 3"  (1.6 m)   Wt 192 lb 12.8 oz (87.5 kg)   SpO2 97%   BMI 34.15 kg/m    Subjective:    Patient ID: Kaitlyn Frazier, female    DOB: Sep 20, 1961, 62 y.o.   MRN: [id number]  HPI: Kaitlyn Frazier is a 62 y.o. female  Chief Complaint  Patient presents with   Referral     Patient says she would like to discuss a referral to a Psychiatrist and says she can never get in with the one she works with and it is causing issues with her medications.    Insomnia   Medication Management   Has been seeing Dr. Elna Breslow  INSOMNIA- Alfonso Patten was helping her sleep, but it is not anymore. She notes that she is still not sleeping. She notes that she has been having been problems with memory with the lunesta- has been spelling  Duration: {Blank single:19197::"chronic","months","years"} Satisfied with sleep quality: {Blank single:19197::"yes","no"} Difficulty falling asleep: {Blank single:19197::"yes","no"} Difficulty staying asleep: {Blank single:19197::"yes","no"} Waking a few hours after sleep onset: {Blank single:19197::"yes","no"} Early morning awakenings: {Blank single:19197::"yes","no"} Daytime hypersomnolence: {Blank single:19197::"yes","no"} Wakes feeling refreshed: {Blank single:19197::"yes","no"} Good sleep hygiene: {Blank single:19197::"yes","no"} Apnea: {Blank single:19197::"yes","no"} Snoring: {Blank single:19197::"yes","no"} Depressed/anxious mood: {Blank single:19197::"yes","no"} Recent stress: {Blank single:19197::"yes","no"} Restless legs/nocturnal leg cramps: {Blank single:19197::"yes","no"} Chronic pain/arthritis: {Blank single:19197::"yes","no"} History of sleep study: {Blank single:19197::"yes","no"} Treatments attempted: Vernon Prey, doxipin, trazodone,    Relevant past medical, surgical, family and social history reviewed and updated as indicated. Interim medical history since our last visit  reviewed. Allergies and medications reviewed and updated.  Review of Systems  Constitutional: Negative.   Respiratory: Negative.    Cardiovascular: Negative.   Musculoskeletal: Negative.   Psychiatric/Behavioral:  Positive for dysphoric mood and sleep disturbance. Negative for agitation, behavioral problems, confusion, decreased concentration, hallucinations, self-injury and suicidal ideas. The patient is nervous/anxious. The patient is not hyperactive.     Per HPI unless specifically indicated above     Objective:    BP 137/82   Pulse 85   Temp 98.5 F (36.9 C) (Oral)   Ht 5\' 3"  (1.6 m)   Wt 192 lb 12.8 oz (87.5 kg)   SpO2 97%   BMI 34.15 kg/m   Wt Readings from Last 3 Encounters:  04/13/23 192 lb 12.8 oz (87.5 kg)  04/11/23 193 lb (87.5 kg)  03/25/23 193 lb 11.2 oz (87.9 kg)    Physical Exam  Results for orders placed or performed in visit on 03/25/23  Surgical pathology  Result Value Ref Range   SURGICAL PATHOLOGY      SURGICAL PATHOLOGY CASE: [id number] PATIENT: Kaitlyn Frazier Surgical Pathology Report     Clinical History: 1.5 cm stuck on hyperpigmented lesion on left side with areas of dark change within (cm)     FINAL MICROSCOPIC DIAGNOSIS:  A. SKIN, LEFT SIDE FLANK, EXCISION: -  Seborrheic keratosis.    GROSS DESCRIPTION:  Received in formalin is a gray-tan fragment of soft tissue measuring 0.9 x 0.9 x 0.3 cm.  The resection margin is inked yellow and the fragment is serially sectioned revealing an underlying tan cut surface.  The specimen is entirely submitted in 1 block. (KW, 03/29/2023)   Final Diagnosis performed by Orene Desanctis DO.   Electronically signed 03/30/2023 Technical component performed at [facility], [address] [place], [place] [address].  Professional component performed at [facility], [address] [place], [place] [address].  Immunohistochemistry Technical component (if  applicable) was perfor med at [facility]. [address], [place], [place] [address].   IMMUNOHISTOCHEMISTRY DISCLAIMER (if applicable): Some of these immunohistochemical stains may have been developed and the performance characteristics determine by [facility]. Some may not have been cleared or approved by the U.S. Food and Drug Administration. The FDA has determined that such clearance or approval is not necessary. This test is used for clinical purposes. It should not be regarded as investigational or for research. This laboratory is certified under the Clinical Laboratory Improvement Amendments of 1988 (CLIA-88) as qualified to perform high complexity clinical laboratory testing.  The controls stained appropriately.       Assessment & Plan:   Problem List Items Addressed This Visit   None    Follow up plan: No follow-ups on file.

## 2023-04-14 ENCOUNTER — Encounter: Payer: Self-pay | Admitting: Family Medicine

## 2023-04-14 NOTE — Assessment & Plan Note (Addendum)
Would like to see a new psychiatrist. Referral placed today. Refills given today. Call with any concerns. Will give short supply of valium. Discussed that this would need to be continued by psychiatry.

## 2023-04-14 NOTE — Assessment & Plan Note (Signed)
Not under good control. Has failed  lunesta, ambien, doxipin, and trazodone. Will try belsomra. Call with any concerns.

## 2023-04-19 ENCOUNTER — Ambulatory Visit: Payer: Medicare Other | Admitting: Physician Assistant

## 2023-04-19 ENCOUNTER — Encounter: Payer: Self-pay | Admitting: Physician Assistant

## 2023-04-19 ENCOUNTER — Other Ambulatory Visit: Payer: Self-pay

## 2023-04-19 VITALS — BP 136/83 | HR 93 | Temp 98.7°F | Ht 63.0 in | Wt 191.4 lb

## 2023-04-19 DIAGNOSIS — K5909 Other constipation: Secondary | ICD-10-CM | POA: Diagnosis not present

## 2023-04-19 DIAGNOSIS — R1314 Dysphagia, pharyngoesophageal phase: Secondary | ICD-10-CM

## 2023-04-19 DIAGNOSIS — Z8619 Personal history of other infectious and parasitic diseases: Secondary | ICD-10-CM

## 2023-04-19 DIAGNOSIS — Z860100 Personal history of colon polyps, unspecified: Secondary | ICD-10-CM

## 2023-04-19 MED ORDER — PEG 3350-KCL-NA BICARB-NACL 420 G PO SOLR: 4000.0000 mL | Freq: Once | ORAL | 0 refills | Status: AC

## 2023-04-19 MED ORDER — ONDANSETRON HCL 4 MG PO TABS: 4.0000 mg | ORAL_TABLET | Freq: Three times a day (TID) | ORAL | 0 refills | Status: DC | PRN

## 2023-04-19 NOTE — Patient Instructions (Addendum)
Barium swallow scheduled 04/25/23 @ 10:45 arrival. Nothing to eat/drink 4 hours prior.   Constipation, Adult Constipation is when a person has fewer than three bowel movements in a week, has difficulty having a bowel movement, or has stools (feces) that are dry, hard, or larger than normal. Constipation may be caused by an underlying condition. It may become worse with age if a person takes certain medicines and does not take in enough fluids. Follow these instructions at home: Eating and drinking  Eat foods that have a lot of fiber, such as beans, whole grains, and fresh fruits and vegetables. Limit foods that are low in fiber and high in fat and processed sugars, such as fried or sweet foods. These include french fries, hamburgers, cookies, candies, and soda. Drink enough fluid to keep your urine pale yellow. General instructions Exercise regularly or as told by your health care provider. Try to do 150 minutes of moderate exercise each week. Use the bathroom when you have the urge to go. Do not hold it in. Take over-the-counter and prescription medicines only as told by your health care provider. This includes any fiber supplements. During bowel movements: Practice deep breathing while relaxing the lower abdomen. Practice pelvic floor relaxation. Watch your condition for any changes. Let your health care provider know about them. Keep all follow-up visits as told by your health care provider. This is important. Contact a health care provider if: You have pain that gets worse. You have a fever. You do not have a bowel movement after 4 days. You vomit. You are not hungry or you lose weight. You are bleeding from the opening between the buttocks (anus). You have thin, pencil-like stools. Get help right away if: You have a fever and your symptoms suddenly get worse. You leak stool or have blood in your stool. Your abdomen is bloated. You have severe pain in your abdomen. You feel dizzy  or you faint. Summary Constipation is when a person has fewer than three bowel movements in a week, has difficulty having a bowel movement, or has stools (feces) that are dry, hard, or larger than normal. Eat foods that have a lot of fiber, such as beans, whole grains, and fresh fruits and vegetables. Drink enough fluid to keep your urine pale yellow. Take over-the-counter and prescription medicines only as told by your health care provider. This includes any fiber supplements. This information is not intended to replace advice given to you by your health care provider. Make sure you discuss any questions you have with your health care provider. Document Revised: 10/06/2022 Document Reviewed: 10/06/2022 Elsevier Patient Education  2023 [employer].

## 2023-04-19 NOTE — Progress Notes (Signed)
Celso Amy, PA-C [address]  [address]  [place], [place] [address]  Main: [phone]  Fax: [phone]   Gastroenterology Consultation  Referring Provider:     Dorcas Carrow, DO Primary Care Physician:  Dorcas Carrow, DO Primary Gastroenterologist:  Celso Amy, PA-C / Dr. Wyline Mood  Reason for Consultation:     Colonoscopy, Hiatal Hernia, Hx H. Pylori        HPI:   Kaitlyn Frazier is a 62 y.o. y/o female referred for consultation & management  by Dorcas Carrow, DO.    Patient has multiple GI symptoms.  She reports having chronic constipation all of her life.  No current treatment.  Has a large bowel movement once per week.  Occasionally sees bright red blood on the tissue after hard bowel movement.  Denies abdominal pain or unintentional weight loss.  No family history of colon cancer.  She reports having colonoscopy 5 years ago in [place].  She was told she had colon polyps removed and advised to repeat colonoscopy in 5 years.  She reports having 2 or 3 previous colonoscopies done in [place].  We are requesting GI records.  She reports getting nauseated with colonoscopy prep and is requesting Zofran.  She reports being awake during her last colonoscopy (with Versed) and is requesting to be fully sedated.  She reports dysphagia to pills getting stuck in her throat and mid upper chest for 6 months.  She has had previous neck surgery.  Denies vomiting or food bolus episodes.  Currently takes omeprazole 20 mg daily with good control of acid reflux.  Occasionally takes omeprazole 40 mg if needed.  She reports having an EGD 5 years ago in [place].  She reports being treated for H. pylori 5 years ago.  She is requesting to have repeat EGD.  She denies any upper abdominal pain.  CT abdomen pelvis with contrast done 04/2022, to evaluate abdominal pain, showed hepatic steatosis.  No other abnormality.  Previous hysterectomy.  No gallstones or bowel  inflammation.  She has had previous inguinal hernia repair.  Past Medical History:  Diagnosis Date   Abnormal vaginal Pap smear    Depression    Fibromyalgia    hx of Conversion disorder    hx of Peritonitis (HCC)    Mitral valve regurgitation    Sleep apnea    Thyroid nodule     Past Surgical History:  Procedure Laterality Date   ABDOMINAL HYSTERECTOMY     ANTERIOR / POSTERIOR COMBINED FUSION CERVICAL SPINE     ANTERIOR LAT LUMBAR FUSION     BREAST BIOPSY N/A    pt unsure which breast 10 years ago-fibrous tissue   CESAREAN SECTION     INGUINAL HERNIA REPAIR     NOSE SURGERY     TUMOR REMOVAL     on her SI joint    Prior to Admission medications   Medication Sig Start Date End Date Taking? Authorizing Provider  albuterol (VENTOLIN HFA) 108 (90 Base) MCG/ACT inhaler Inhale 1-2 puffs into the lungs every 6 (six) hours as needed. 02/08/23   Mickie Bail, NP  buPROPion [facility] SR) 150 MG 12 hr tablet Take 1 tablet (150 mg total) by mouth 2 (two) times daily. 04/13/23   Johnson, Megan P, DO  Cholecalciferol (VITAMIN D-3) 25 MCG (1000 UT) CAPS Take by mouth.    [provider]  diazepam (VALIUM) 10 MG tablet Take 1 tablet (10 mg total) by  mouth at bedtime as needed for anxiety. 04/13/23   Olevia Perches P, DO  diclofenac (VOLTAREN) 50 MG EC tablet 1 tablet as needed Orally Twice a day for 30 days 01/04/23   [provider]  DULoxetine (CYMBALTA) 60 MG capsule Take 1 capsule (60 mg total) by mouth daily. 04/13/23   Johnson, Megan P, DO  estradiol (ESTRACE) 0.1 MG/GM vaginal cream Place vaginally. 09/08/22   [provider]  hydrochlorothiazide (HYDRODIURIL) 25 MG tablet Take 1 tablet (25 mg total) by mouth daily. 12/23/22   Johnson, Megan P, DO  hydrOXYzine (ATARAX) 50 MG tablet Take 0.5-1 tablets (25-50 mg total) by mouth at bedtime. 10/20/22   Johnson, Megan P, DO  metroNIDAZOLE (METROGEL) 1 % gel Apply topically daily. 10/13/22   Johnson, Megan P, DO   omeprazole (PRILOSEC) 20 MG capsule Take 1 capsule (20 mg total) by mouth daily. 04/13/23   Johnson, Megan P, DO  pregabalin (LYRICA) 150 MG capsule Take 1 capsule (150 mg total) by mouth daily. 04/13/23   Johnson, Megan P, DO  Suvorexant (BELSOMRA) 10 MG TABS Take 1 tablet (10 mg total) by mouth at bedtime as needed. 04/13/23   Johnson, Megan P, DO  traMADol (ULTRAM) 50 MG tablet 50 mg 2 (two) times daily. 01/04/23   [provider]    Family History  Problem Relation Age of Onset   Alcohol abuse Mother    Bipolar disorder Mother    Uterine cancer Paternal Aunt        17s   Ovarian cancer Paternal Aunt        66s   Ovarian cancer Paternal Aunt        71s   Ovarian cancer Cousin        28s   Tourette syndrome Son      Social History   Tobacco Use   Smoking status: Never    Passive exposure: Never   Smokeless tobacco: Never  Vaping Use   Vaping Use: Some days   Substances: Nicotine  Substance Use Topics   Alcohol use: Never   Drug use: Never    Allergies as of 04/19/2023 - Review Complete 04/19/2023  Allergen Reaction Noted   Gabapentin Other (See Comments), Hives, and Rash 04/16/2022   Amitriptyline Other (See Comments) 04/16/2022   Baclofen Other (See Comments) 04/16/2022   Cyclobenzaprine Other (See Comments) 04/16/2022   Doxepin  11/24/2022   Latex Hives 04/16/2022   Norflex tablets [orphenadrine]  05/24/2022   Pollen extract Itching 04/16/2022   Tizanidine Other (See Comments) 08/04/2022   Other Rash 04/16/2022    Review of Systems:    All systems reviewed and negative except where noted in HPI.   Physical Exam:  BP 136/83   Pulse 93   Temp 98.7 F (37.1 C)   Ht 5\' 3"  (1.6 m)   Wt 191 lb 6.4 oz (86.8 kg)   BMI 33.90 kg/m  No LMP recorded. Patient has had a hysterectomy. Psych:  Alert and cooperative. Normal mood and affect. General:   Alert,  Well-developed, well-nourished, pleasant and cooperative in NAD Head:  Normocephalic and  atraumatic. Eyes:  Sclera clear, no icterus.   Conjunctiva pink. Ears:  Normal auditory acuity. Neck:  Supple; no masses or thyromegaly. Lungs:  Respirations even and unlabored.  Clear throughout to auscultation.   No wheezes, crackles, or rhonchi. No acute distress. Heart:  Regular rate and rhythm; no murmurs, clicks, rubs, or gallops. Abdomen:  Normal bowel sounds.  No bruits.  Soft, non-tender and non-distended without masses, hepatosplenomegaly or hernias noted.  No guarding or rebound tenderness.    Neurologic:  Alert and oriented x3;  grossly normal neurologically. Psych:  Alert and cooperative. Normal mood and affect.  Imaging Studies: No results found.  Assessment and Plan:   Kaitlyn Frazier is a 62 y.o. y/o female has been referred for multiple GI symptoms.  She reports having previous EGD and colonoscopy done in [place] 5 years ago.  We are requesting GI records.  She reports having 2 or 3 previous colonoscopies.  She was told she had colon polyps and to repeat colonoscopy in 5 years.  She states she was treated for H. pylori 5 years ago.  She is requesting to have repeat EGD.  She is having some dysphagia to large pills getting stuck in her throat and mid upper chest.  Scheduling a barium swallow with tablet for evaluation.  Previous history of neck surgery which may be contributing.  Dysphagia  Scheduling Barium Swallow With Tablet  Scheduling EGD with Possible Dilation   History of H. Pylori / Hiatal Hernia  Scheduling EGD I discussed risks of EGD with patient to include risk of bleeding, perforation, and risk of sedation.   Patient expressed understanding and agrees to proceed with EGD.   3.   GERD  Continue Omeprazole 20mg  once or twice daily as needed.  Continue avoiding GERD trigger foods / drinks.  4.   Chronic Constipation  Discussed constipation treatment at length. Recommend High Fiber diet with fruits, vegetables, and whole grains. Drink 64 ounces of  Fluids Daily. Start Miralax Mix 1 capful in a drink daily. If no improvement, consider Linzess, Amitiza or Trulance.   5.  History of Colon Polyps Scheduling Colonoscopy I discussed risks of colonoscopy with patient to include risk of bleeding, colon perforation, and risk of sedation.   Patient expressed understanding and agrees to proceed with colonoscopy.  **Requesting previous GI records from [facility].  Previous EGDs and colonoscopies for review.    Follow up in 3 Months.  Celso Amy, PA-C

## 2023-04-25 ENCOUNTER — Other Ambulatory Visit: Payer: Medicare Other

## 2023-04-25 ENCOUNTER — Ambulatory Visit
Admission: RE | Admit: 2023-04-25 | Discharge: 2023-04-25 | Disposition: A | Discharge status: Home / Self Care | Payer: Medicare Other | Source: Ambulatory Visit | Attending: Physician Assistant | Admitting: Physician Assistant

## 2023-04-25 ENCOUNTER — Telehealth: Payer: Self-pay

## 2023-04-25 ENCOUNTER — Other Ambulatory Visit: Payer: Self-pay | Admitting: Physician Assistant

## 2023-04-25 DIAGNOSIS — R1314 Dysphagia, pharyngoesophageal phase: Secondary | ICD-10-CM | POA: Insufficient documentation

## 2023-04-25 DIAGNOSIS — K5909 Other constipation: Secondary | ICD-10-CM

## 2023-04-25 DIAGNOSIS — Z86010 Personal history of colonic polyps: Secondary | ICD-10-CM

## 2023-04-25 DIAGNOSIS — Z8619 Personal history of other infectious and parasitic diseases: Secondary | ICD-10-CM

## 2023-04-25 MED ORDER — PEG 3350-KCL-NA BICARB-NACL 420 G PO SOLR: 4000.0000 mL | Freq: Once | ORAL | 0 refills | Status: AC

## 2023-04-25 NOTE — Telephone Encounter (Signed)
Patient notified.    Notify patient barium swallow test shows a small hiatal hernia.  No evidence of esophageal stricture or masses.  Continue with current plan for EGD and colonoscopy as scheduled.

## 2023-04-28 ENCOUNTER — Ambulatory Visit
Admission: RE | Admit: 2023-04-28 | Discharge: 2023-04-28 | Disposition: A | Discharge status: Home / Self Care | Payer: Medicare Other | Source: Ambulatory Visit | Attending: Family Medicine | Admitting: Family Medicine

## 2023-04-28 ENCOUNTER — Telehealth: Payer: Self-pay

## 2023-04-28 ENCOUNTER — Encounter: Payer: Self-pay | Admitting: Family Medicine

## 2023-04-28 ENCOUNTER — Ambulatory Visit: Payer: Medicare Other | Admitting: Family Medicine

## 2023-04-28 VITALS — BP 125/80 | HR 83 | Temp 98.4°F | Ht 63.0 in | Wt 189.2 lb

## 2023-04-28 DIAGNOSIS — M9902 Segmental and somatic dysfunction of thoracic region: Secondary | ICD-10-CM | POA: Diagnosis not present

## 2023-04-28 DIAGNOSIS — M9904 Segmental and somatic dysfunction of sacral region: Secondary | ICD-10-CM | POA: Diagnosis not present

## 2023-04-28 DIAGNOSIS — M9905 Segmental and somatic dysfunction of pelvic region: Secondary | ICD-10-CM

## 2023-04-28 DIAGNOSIS — G4700 Insomnia, unspecified: Secondary | ICD-10-CM | POA: Diagnosis not present

## 2023-04-28 DIAGNOSIS — M9909 Segmental and somatic dysfunction of abdomen and other regions: Secondary | ICD-10-CM

## 2023-04-28 DIAGNOSIS — M9903 Segmental and somatic dysfunction of lumbar region: Secondary | ICD-10-CM

## 2023-04-28 DIAGNOSIS — G4733 Obstructive sleep apnea (adult) (pediatric): Secondary | ICD-10-CM

## 2023-04-28 DIAGNOSIS — M47812 Spondylosis without myelopathy or radiculopathy, cervical region: Secondary | ICD-10-CM | POA: Insufficient documentation

## 2023-04-28 DIAGNOSIS — M4722 Other spondylosis with radiculopathy, cervical region: Secondary | ICD-10-CM | POA: Diagnosis not present

## 2023-04-28 DIAGNOSIS — M9900 Segmental and somatic dysfunction of head region: Secondary | ICD-10-CM

## 2023-04-28 DIAGNOSIS — Z780 Asymptomatic menopausal state: Secondary | ICD-10-CM | POA: Insufficient documentation

## 2023-04-28 DIAGNOSIS — Z13820 Encounter for screening for osteoporosis: Secondary | ICD-10-CM

## 2023-04-28 DIAGNOSIS — M9901 Segmental and somatic dysfunction of cervical region: Secondary | ICD-10-CM

## 2023-04-28 DIAGNOSIS — M9908 Segmental and somatic dysfunction of rib cage: Secondary | ICD-10-CM

## 2023-04-28 NOTE — Assessment & Plan Note (Signed)
Not better. OSA likely complicating. Will start her back on ambien and recheck in about a month. Recommended she follow up with psychiatry for her mood and sleep.

## 2023-04-28 NOTE — Telephone Encounter (Signed)
-----   Message from Dorcas Carrow, DO sent at 04/28/2023 11:37 AM EDT ----- Needs CPAP order please- results should be in care everywhere for sleep study

## 2023-04-28 NOTE — Assessment & Plan Note (Signed)
Point tenderness at T5. X-ray negative.

## 2023-04-28 NOTE — Telephone Encounter (Signed)
DME order is placed and placed in provider's folder along with most recent sleep study to sign for completion. Please advise?

## 2023-04-28 NOTE — Patient Instructions (Signed)
[facility] [employer] P: [phone] F: [phone]

## 2023-04-28 NOTE — Assessment & Plan Note (Signed)
S/p surgery. No better with PT x2 months. Will get her in for MRI. Await results.

## 2023-04-28 NOTE — Progress Notes (Signed)
BP 125/80   Pulse 83   Temp 98.4 F (36.9 C) (Oral)   Ht 5\' 3"  (1.6 m)   Wt 189 lb 3.2 oz (85.8 kg)   SpO2 95%   BMI 33.52 kg/m    Subjective:    Patient ID: Kaitlyn Frazier, female    DOB: Jan 25, 1961, 62 y.o.   MRN: [id number]  HPI: Kaitlyn Frazier is a 62 y.o. female  Chief Complaint  Patient presents with   chronic pain syndrome   Insomnia   INSOMNIA- did not feel like the belsomra did anything to help her sleep. Feels like the [place] DID help sleep, but she was having memory issues with them previously. Has been diagnosed with severe sleep apnea. Not using CPAP. Duration: chronic Satisfied with sleep quality: no Difficulty falling asleep: yes Difficulty staying asleep: yes Waking a few hours after sleep onset: yes Early morning awakenings: yes Daytime hypersomnolence: yes Wakes feeling refreshed: no Good sleep hygiene: yes Apnea: yes Snoring: yes Depressed/anxious mood: yes Recent stress: yes Restless legs/nocturnal leg cramps: no Chronic pain/arthritis: yes History of sleep study: no Treatments attempted: Vernon Prey, doxipin, trazodone, belsomra   SLEEP APNEA Sleep apnea status: uncontrolled Duration: chronic Satisfied with current treatment?:  no CPAP use:  no Sleep quality with CPAP use: N/A Treament compliance:does not have CPAP Last sleep study: December 2023 at [facility] Treatments attempted: None Wakes feeling refreshed:  no Daytime hypersomnolence:  yes Fatigue:  yes Insomnia:  yes Good sleep hygiene:  yes Difficulty falling asleep:  yes Difficulty staying asleep:  yes Snoring bothers bed partner:  yes Observed apnea by bed partner: yes Obesity:  yes Hypertension: yes  Pulmonary hypertension:  no Coronary artery disease:  no  Trasha notes that she is having pain in her upper left back and neck. She has been doing PT but doesn't feel like it's been helping. Conitnues with pain in her low back on the R side over her SI joint.  Has been having point tenderness in her low back. She notes that her pain is aching and sore in nature. Neck is stiff and radiates into her arm and upper back. Nothing makes the pain better and certain movements and sleeping makes her pain worse. She is otherwise OK. No other concerns or complaints at this time.   Relevant past medical, surgical, family and social history reviewed and updated as indicated. Interim medical history since our last visit reviewed. Allergies and medications reviewed and updated.  Review of Systems  Constitutional:  Positive for fatigue. Negative for activity change, appetite change, chills, diaphoresis, fever and unexpected weight change.  HENT: Negative.    Respiratory: Negative.    Cardiovascular: Negative.   Musculoskeletal:  Positive for arthralgias, back pain, myalgias, neck pain and neck stiffness. Negative for gait problem and joint swelling.  Skin: Negative.   Neurological: Negative.   Psychiatric/Behavioral:  Positive for confusion, dysphoric mood and sleep disturbance. Negative for agitation, behavioral problems, decreased concentration, hallucinations, self-injury and suicidal ideas. The patient is nervous/anxious. The patient is not hyperactive.     Per HPI unless specifically indicated above     Objective:    BP 125/80   Pulse 83   Temp 98.4 F (36.9 C) (Oral)   Ht 5\' 3"  (1.6 m)   Wt 189 lb 3.2 oz (85.8 kg)   SpO2 95%   BMI 33.52 kg/m   Wt Readings from Last 3 Encounters:  04/28/23 189 lb 3.2 oz (85.8 kg)  04/19/23 191 lb  6.4 oz (86.8 kg)  04/13/23 192 lb 12.8 oz (87.5 kg)    Physical Exam Vitals and nursing note reviewed.  Constitutional:      General: She is not in acute distress.    Appearance: Normal appearance. She is obese. She is not ill-appearing.  HENT:     Head: Normocephalic and atraumatic.     Right Ear: External ear normal.     Left Ear: External ear normal.     Nose: Nose normal.     Mouth/Throat:     Mouth: Mucous  membranes are moist.     Pharynx: Oropharynx is clear.  Eyes:     Extraocular Movements: Extraocular movements intact.     Conjunctiva/sclera: Conjunctivae normal.     Pupils: Pupils are equal, round, and reactive to light.  Neck:     Vascular: No carotid bruit.  Cardiovascular:     Rate and Rhythm: Normal rate.     Pulses: Normal pulses.  Pulmonary:     Effort: Pulmonary effort is normal. No respiratory distress.  Abdominal:     General: Abdomen is flat. There is no distension.     Palpations: Abdomen is soft. There is no mass.     Tenderness: There is no abdominal tenderness. There is no right CVA tenderness, left CVA tenderness, guarding or rebound.     Hernia: No hernia is present.  Musculoskeletal:     Cervical back: No muscular tenderness.  Lymphadenopathy:     Cervical: No cervical adenopathy.  Skin:    General: Skin is warm and dry.     Capillary Refill: Capillary refill takes less than 2 seconds.     Coloration: Skin is not jaundiced or pale.     Findings: No bruising, erythema, lesion or rash.  Neurological:     General: No focal deficit present.     Mental Status: She is alert. Mental status is at baseline.  Psychiatric:        Mood and Affect: Mood normal.        Behavior: Behavior normal.        Thought Content: Thought content normal.        Judgment: Judgment normal.   Musculoskeletal:  Exam found Decreased ROM, Tissue texture changes, Tenderness to palpation, and Asymmetry of patient's  head, neck, thorax, ribs, lumbar, pelvis, sacrum, and abdomen Osteopathic Structural Exam:   Head: hypertonic suboccipital muscles, OAESSR  Neck: C4ESRL, C5ESRL, hypertonic trap on the L, SCM hypertonic on the R   Thorax: T3-5SLRR, trap hypertonic on the L  Ribs: Ribs 5-6 locked up on the L  Lumbar:  QL hypertonic on the R, L3-5SRRL  Pelvis: Anterior R innominate  Sacrum: R on R torsion  Abdomen: Diaphragm hypertonic bilaterally R>L   Results for orders placed or  performed in visit on 03/25/23  Surgical pathology  Result Value Ref Range   SURGICAL PATHOLOGY      SURGICAL PATHOLOGY CASE: [id number] PATIENT: Kaitlyn Frazier Surgical Pathology Report     Clinical History: 1.5 cm stuck on hyperpigmented lesion on left side with areas of dark change within (cm)     FINAL MICROSCOPIC DIAGNOSIS:  A. SKIN, LEFT SIDE FLANK, EXCISION: -  Seborrheic keratosis.    GROSS DESCRIPTION:  Received in formalin is a gray-tan fragment of soft tissue measuring 0.9 x 0.9 x 0.3 cm.  The resection margin is inked yellow and the fragment is serially sectioned revealing an underlying tan cut surface.  The specimen is entirely submitted in  1 block. (KW, 03/29/2023)   Final Diagnosis performed by Orene Desanctis DO.   Electronically signed 03/30/2023 Technical component performed at [employer]. [facility], [address] [place], [place] [address].  Professional component performed at [facility], [address], [place], [place] [address].  Immunohistochemistry Technical component (if applicable) was perfor med at [employer]. [address], [place], [place] [address].   IMMUNOHISTOCHEMISTRY DISCLAIMER (if applicable): Some of these immunohistochemical stains may have been developed and the performance characteristics determine by [facility]. Some may not have been cleared or approved by the U.S. Food and Drug Administration. The FDA has determined that such clearance or approval is not necessary. This test is used for clinical purposes. It should not be regarded as investigational or for research. This laboratory is certified under the Clinical Laboratory Improvement Amendments of 1988 (CLIA-88) as qualified to perform high complexity clinical laboratory testing.  The controls stained appropriately.       Assessment & Plan:   Problem List Items Addressed This Visit        Respiratory   Obstructive sleep apnea syndrome    Has not gotten her CPAP. No longer seeing sleep medicine. Will order CPAP. OK to follow up with TMJ specialist in July as scheduled.         Musculoskeletal and Integument   DJD (degenerative joint disease) of cervical spine    S/p surgery. No better with PT x2 months. Will get her in for MRI. Await results.       Relevant Orders   MR Cervical Spine Wo Contrast     Other   Insomnia - Primary    Not better. OSA likely complicating. Will start her back on ambien and recheck in about a month. Recommended she follow up with psychiatry for her mood and sleep.       Other Visit Diagnoses     Head region somatic dysfunction       Cervical segment dysfunction       Thoracic segment dysfunction       Somatic dysfunction of lumbar region       Somatic dysfunction of sacral region       Somatic dysfunction of pelvis region       Rib cage region somatic dysfunction       Segmental dysfunction of abdomen         After verbal consent was obtained, patient was treated today with osteopathic manipulative medicine to the regions of the head, neck, thorax, ribs, lumbar, pelvis, sacrum, and abdomen using the techniques of cranial, myofascial release, counterstrain, muscle energy, HVLA, and soft tissue. Areas of compensation relating to her primary pain source also treated. Patient tolerated the procedure well with good objective and fair subjective improvement in symptoms. She left the room in good condition. She was advised to stay well hydrated and that she may have some soreness following the procedure. If not improving or worsening, she will call and come in. She will return for reevaluation  on a PRN basis.    Follow up plan: Return if symptoms worsen or fail to improve.

## 2023-04-28 NOTE — Assessment & Plan Note (Signed)
Has not gotten her CPAP. No longer seeing sleep medicine. Will order CPAP. OK to follow up with TMJ specialist in July as scheduled.

## 2023-05-01 ENCOUNTER — Encounter: Payer: Self-pay | Admitting: Family Medicine

## 2023-05-01 MED ORDER — ZOLPIDEM TARTRATE 5 MG PO TABS: 5.0000 mg | ORAL_TABLET | Freq: Every evening | ORAL | 0 refills | Status: DC | PRN

## 2023-05-04 ENCOUNTER — Telehealth: Payer: Self-pay

## 2023-05-04 NOTE — Telephone Encounter (Signed)
Copied from CRM [id number]. Topic: General - Other >> May 04, 2023  1:28 PM Everette C wrote: Reason for CRM: The patient has called regarding their recent order request for CPAP supplies   The patient has been told that their sleep study, recommended pressure settings as well as a prescription for a CPAP machine are needed for their prescription   Please contact the patient further if needed

## 2023-05-04 NOTE — Telephone Encounter (Signed)
Left message for patient to give our office a call back to let her know that we have reached out to [facility] to follow up to check status of CPAP order. [facility] says they have all the information they need to move forward with processing the CPAP order and will give our office a call back if there is anything they need to move forward.    OK for PEC to give note if patient calls back.

## 2023-05-10 ENCOUNTER — Ambulatory Visit
Admission: RE | Admit: 2023-05-10 | Discharge: 2023-05-10 | Disposition: A | Discharge status: Home / Self Care | Payer: Medicare Other | Source: Ambulatory Visit | Attending: Family Medicine | Admitting: Family Medicine

## 2023-05-10 ENCOUNTER — Telehealth: Payer: Self-pay | Admitting: Family Medicine

## 2023-05-10 ENCOUNTER — Ambulatory Visit: Payer: Medicare Other

## 2023-05-10 ENCOUNTER — Ambulatory Visit
Admission: RE | Admit: 2023-05-10 | Discharge: 2023-05-10 | Disposition: A | Discharge status: Home / Self Care | Payer: Medicare Other | Attending: Family Medicine | Admitting: Family Medicine

## 2023-05-10 DIAGNOSIS — M25561 Pain in right knee: Secondary | ICD-10-CM | POA: Diagnosis present

## 2023-05-10 NOTE — Telephone Encounter (Signed)
I cannot do that. I'm sorry. I do not manage conscious sedation. The only thing I can offer is the pill.

## 2023-05-10 NOTE — Telephone Encounter (Unsigned)
Copied from CRM [id number]. Topic: General - Inquiry >> May 10, 2023 11:18 AM De Blanch wrote: Reason for CRM:Pt stated she has two upcoming MRI's this Friday; she is extremely Claustrophobic; the location [facility] does not have an open MRI. Pt is requesting sedation; she stated she was advised of this to be awake enough to respond to commands. Stated Valium works enough to relax her, but it does not allow her to get through the process.  Please advise.

## 2023-05-10 NOTE — Telephone Encounter (Signed)
Spoke with patient and she was told by imaging scheduling department that if she can have an order from her PCP provider to have the Radiology department provided the sedation during the MRI with doctor's order. Please advise?

## 2023-05-10 NOTE — Telephone Encounter (Signed)
I cannot give her anything other than valium or something similar. We do not have sedating ability and cannot safely do that without a physician on site and I am not on site at the MRI.

## 2023-05-10 NOTE — Telephone Encounter (Signed)
Kaitlyn Frazier- can we please see if we can get this rescheduled somewhere that has an open MRI?

## 2023-05-11 NOTE — Addendum Note (Signed)
Addended by: Dorcas Carrow on: 05/11/2023 09:44 AM   Modules accepted: Orders

## 2023-05-11 NOTE — Telephone Encounter (Signed)
New order is in- it won't let me cancel the previous MRI

## 2023-05-12 NOTE — Telephone Encounter (Signed)
Called and spoke to patient. She states that she is just going to try to do the MRI without the sedation.

## 2023-05-13 ENCOUNTER — Encounter: Payer: Self-pay | Admitting: Family Medicine

## 2023-05-13 ENCOUNTER — Ambulatory Visit
Admission: RE | Admit: 2023-05-13 | Discharge: 2023-05-13 | Disposition: A | Discharge status: Home / Self Care | Payer: Medicare Other | Source: Ambulatory Visit | Attending: Family Medicine | Admitting: Family Medicine

## 2023-05-13 DIAGNOSIS — M5416 Radiculopathy, lumbar region: Secondary | ICD-10-CM | POA: Diagnosis not present

## 2023-05-13 DIAGNOSIS — M4722 Other spondylosis with radiculopathy, cervical region: Secondary | ICD-10-CM | POA: Insufficient documentation

## 2023-05-25 ENCOUNTER — Telehealth: Payer: Self-pay

## 2023-05-25 ENCOUNTER — Telehealth: Payer: Self-pay | Admitting: Gastroenterology

## 2023-05-25 NOTE — Telephone Encounter (Signed)
Spoke with  patient-currently scheduled for Colonoscopy /EGD 06/16/23 w/ Dr.Anna- patient is requesting to change date to 07/25/23. I spoke with Raynelle Fanning at Endoscopy unit and change was made.

## 2023-05-25 NOTE — Telephone Encounter (Signed)
Patient (Kaitlyn Frazier) called in to see if you can change her appointment to a Monday.

## 2023-05-26 ENCOUNTER — Other Ambulatory Visit: Payer: Self-pay | Admitting: Family Medicine

## 2023-05-26 DIAGNOSIS — M4722 Other spondylosis with radiculopathy, cervical region: Secondary | ICD-10-CM

## 2023-05-31 ENCOUNTER — Ambulatory Visit: Payer: Medicare Other | Admitting: Family Medicine

## 2023-05-31 ENCOUNTER — Other Ambulatory Visit: Payer: Self-pay

## 2023-05-31 ENCOUNTER — Telehealth: Payer: Self-pay | Admitting: Physician Assistant

## 2023-05-31 ENCOUNTER — Encounter: Payer: Self-pay | Admitting: Family Medicine

## 2023-05-31 VITALS — BP 133/82 | HR 81 | Temp 98.0°F | Wt 184.2 lb

## 2023-05-31 DIAGNOSIS — M9902 Segmental and somatic dysfunction of thoracic region: Secondary | ICD-10-CM

## 2023-05-31 DIAGNOSIS — R197 Diarrhea, unspecified: Secondary | ICD-10-CM | POA: Diagnosis not present

## 2023-05-31 DIAGNOSIS — M4722 Other spondylosis with radiculopathy, cervical region: Secondary | ICD-10-CM | POA: Diagnosis not present

## 2023-05-31 DIAGNOSIS — M9909 Segmental and somatic dysfunction of abdomen and other regions: Secondary | ICD-10-CM

## 2023-05-31 DIAGNOSIS — M9904 Segmental and somatic dysfunction of sacral region: Secondary | ICD-10-CM | POA: Diagnosis not present

## 2023-05-31 DIAGNOSIS — M9905 Segmental and somatic dysfunction of pelvic region: Secondary | ICD-10-CM

## 2023-05-31 DIAGNOSIS — M9908 Segmental and somatic dysfunction of rib cage: Secondary | ICD-10-CM | POA: Diagnosis not present

## 2023-05-31 DIAGNOSIS — M9903 Segmental and somatic dysfunction of lumbar region: Secondary | ICD-10-CM

## 2023-05-31 DIAGNOSIS — G4733 Obstructive sleep apnea (adult) (pediatric): Secondary | ICD-10-CM

## 2023-05-31 DIAGNOSIS — M9901 Segmental and somatic dysfunction of cervical region: Secondary | ICD-10-CM

## 2023-05-31 DIAGNOSIS — G4700 Insomnia, unspecified: Secondary | ICD-10-CM

## 2023-05-31 DIAGNOSIS — M9900 Segmental and somatic dysfunction of head region: Secondary | ICD-10-CM | POA: Diagnosis not present

## 2023-05-31 LAB — URINALYSIS, ROUTINE W REFLEX MICROSCOPIC
Bilirubin, UA: NEGATIVE
Glucose, UA: NEGATIVE
Ketones, UA: NEGATIVE
Leukocytes,UA: NEGATIVE
Nitrite, UA: NEGATIVE
Protein,UA: NEGATIVE
RBC, UA: NEGATIVE
Specific Gravity, UA: 1.01 (ref 1.005–1.030)
Urobilinogen, Ur: 0.2 mg/dL (ref 0.2–1.0)
pH, UA: 7 (ref 5.0–7.5)

## 2023-05-31 NOTE — Telephone Encounter (Signed)
Left message for patient to return my call-    Please see my note from 6-19/24 -patient requested appointment be changed -

## 2023-05-31 NOTE — Telephone Encounter (Signed)
Patient called in call because she don't know why her appointment was changed. Patient said that her appointment needs to be reschedule after her procedure because her procedure was moved to July 25 2023. She said that Inetta Fermo wants to see her after the procedure is done.

## 2023-05-31 NOTE — Progress Notes (Signed)
BP 133/82   Pulse 81   Temp 98 F (36.7 C) (Oral)   Wt 184 lb 3.2 oz (83.6 kg)   SpO2 98%   BMI 32.63 kg/m    Subjective:    Patient ID: Kaitlyn Frazier, female    DOB: 1961-10-27, 62 y.o.   MRN: [id number]  HPI: Kaitlyn Frazier is a 62 y.o. female  Chief Complaint  Patient presents with   Back Pain   Diarrhea   Continues with pain. Has been doing PT, dry needling and cupping, has been able to avoid pain medicine, has been taking voltaren. She notes that her pain is aching and sore. Better with OMT and voltaren and PT. Worse with certain positions and stress. Pain does radiate into her arm.   GASTROENTERITIS Duration: a few days Diarrhea: yes non-bloody  Episodes of diarrhea/day: several times Nausea: yes Vomiting: no Abdominal pain: yes Fever: no Decreased appetite: no Tolerating liquids: yes Foreign travel: no Relevant dietary history:  Similar illness in contacts: no Recent antibiotic use: no Status: stable  Relevant past medical, surgical, family and social history reviewed and updated as indicated. Interim medical history since our last visit reviewed. Allergies and medications reviewed and updated.  Review of Systems  Constitutional: Negative.   Respiratory: Negative.    Cardiovascular: Negative.   Gastrointestinal: Negative.   Musculoskeletal:  Positive for arthralgias, back pain, myalgias, neck pain and neck stiffness. Negative for gait problem and joint swelling.  Skin: Negative.   Neurological: Negative.   Psychiatric/Behavioral: Negative.      Per HPI unless specifically indicated above     Objective:    BP 133/82   Pulse 81   Temp 98 F (36.7 C) (Oral)   Wt 184 lb 3.2 oz (83.6 kg)   SpO2 98%   BMI 32.63 kg/m   Wt Readings from Last 3 Encounters:  05/31/23 184 lb 3.2 oz (83.6 kg)  04/28/23 189 lb 3.2 oz (85.8 kg)  04/19/23 191 lb 6.4 oz (86.8 kg)    Physical Exam Vitals and nursing note reviewed.  Constitutional:      General:  She is not in acute distress.    Appearance: Normal appearance. She is not ill-appearing.  HENT:     Head: Normocephalic and atraumatic.     Right Ear: External ear normal.     Left Ear: External ear normal.     Nose: Nose normal.     Mouth/Throat:     Mouth: Mucous membranes are moist.     Pharynx: Oropharynx is clear.  Eyes:     Extraocular Movements: Extraocular movements intact.     Conjunctiva/sclera: Conjunctivae normal.     Pupils: Pupils are equal, round, and reactive to light.  Neck:     Vascular: No carotid bruit.  Cardiovascular:     Rate and Rhythm: Normal rate.     Pulses: Normal pulses.  Pulmonary:     Effort: Pulmonary effort is normal. No respiratory distress.  Abdominal:     General: Abdomen is flat. There is no distension.     Palpations: Abdomen is soft. There is no mass.     Tenderness: There is no abdominal tenderness. There is no right CVA tenderness, left CVA tenderness, guarding or rebound.     Hernia: No hernia is present.  Musculoskeletal:     Cervical back: No muscular tenderness.  Lymphadenopathy:     Cervical: No cervical adenopathy.  Skin:    General: Skin is warm and dry.  Capillary Refill: Capillary refill takes less than 2 seconds.     Coloration: Skin is not jaundiced or pale.     Findings: No bruising, erythema, lesion or rash.  Neurological:     General: No focal deficit present.     Mental Status: She is alert. Mental status is at baseline.  Psychiatric:        Mood and Affect: Mood normal.        Behavior: Behavior normal.        Thought Content: Thought content normal.        Judgment: Judgment normal.   Musculoskeletal:  Exam found Decreased ROM, Tissue texture changes, Tenderness to palpation, and Asymmetry of patient's  head, neck, thorax, ribs, lumbar, pelvis, sacrum, and abdomen Osteopathic Structural Exam:   Head: OAESSR, hypertonic suboccipital muscles, SBS SRR  Neck: C4ESRL, SCM hypertonic on the R  Thorax: T5ESRL, trap  hypertonic on the R  Ribs: Ribs 6-8 locked up on the L  Lumbar: QL hypertonic on the L  Pelvis: Anterior L innominate  Sacrum: L on L torsion  Abdomen: diaphragm hypertonic on the L   Results for orders placed or performed in visit on 05/31/23  Fecal occult blood, imunochemical([facility])   Specimen: Stool   ST  Result Value Ref Range   Fecal Occult Bld Negative Negative  Fecal leukocytes   Specimen: Stool   ST     CD- [id number] V  Result Value Ref Range   White Blood Cells (WBC), Stool Final report None Seen   Result 1 Comment   Stool Culture   Specimen: Stool   ST     CD- [id number] V  Result Value Ref Range   Salmonella/Shigella Screen Final report    Stool Culture result 1 ([facility]ASHR) Comment    Campylobacter Culture Preliminary report    Stool Culture result 1 (CMPCXR) Comment    E coli, Shiga toxin Assay Negative Negative  Clostridium difficile EIA   ST     CD- [id number] V  Result Value Ref Range   C difficile Toxins A+B, EIA Negative Negative  Ova and parasite examination   ST     CD- [id number] V  Result Value Ref Range   OVA + PARASITE EXAM Final report    O&P result 1 Comment   Urinalysis, Routine w reflex microscopic  Result Value Ref Range   Specific Gravity, UA 1.010 1.005 - 1.030   pH, UA 7.0 5.0 - 7.5   Color, UA Yellow Yellow   Appearance Ur Clear Clear   Leukocytes,UA Negative Negative   Protein,UA Negative Negative/Trace   Glucose, UA Negative Negative   Ketones, UA Negative Negative   RBC, UA Negative Negative   Bilirubin, UA Negative Negative   Urobilinogen, Ur 0.2 0.2 - 1.0 mg/dL   Nitrite, UA Negative Negative   Microscopic Examination Comment   Comprehensive metabolic panel  Result Value Ref Range   Glucose 104 (H) 70 - 99 mg/dL   BUN 15 8 - 27 mg/dL   Creatinine, Ser 6.29 0.57 - 1.00 mg/dL   eGFR 87 >52 WU/XLK/4.40   BUN/Creatinine Ratio 19 12 - 28   Sodium 136 134 - 144 mmol/L   Potassium 4.3 3.5 - 5.2 mmol/L   Chloride 95 (L) 96 -  106 mmol/L   CO2 23 20 - 29 mmol/L   Calcium 9.9 8.7 - 10.3 mg/dL   Total Protein 6.8 6.0 - 8.5 g/dL   Albumin 4.5 3.9 - 4.9 g/dL   Globulin, Total 2.3 1.5 -  4.5 g/dL   Bilirubin Total 0.3 0.0 - 1.2 mg/dL   Alkaline Phosphatase 77 44 - 121 IU/L   AST 20 0 - 40 IU/L   ALT 23 0 - 32 IU/L  CBC with Differential/Platelet  Result Value Ref Range   WBC 6.9 3.4 - 10.8 x10E3/uL   RBC 4.36 3.77 - 5.28 x10E6/uL   Hemoglobin 12.0 11.1 - 15.9 g/dL   Hematocrit 16.1 09.6 - 46.6 %   MCV 85 79 - 97 fL   MCH 27.5 26.6 - 33.0 pg   MCHC 32.3 31.5 - 35.7 g/dL   RDW 04.5 40.9 - 81.1 %   Platelets 496 (H) 150 - 450 x10E3/uL   Neutrophils 62 Not Estab. %   Lymphs 29 Not Estab. %   Monocytes 7 Not Estab. %   Eos 1 Not Estab. %   Basos 1 Not Estab. %   Neutrophils Absolute 4.2 1.4 - 7.0 x10E3/uL   Lymphocytes Absolute 2.0 0.7 - 3.1 x10E3/uL   Monocytes Absolute 0.5 0.1 - 0.9 x10E3/uL   EOS (ABSOLUTE) 0.1 0.0 - 0.4 x10E3/uL   Basophils Absolute 0.1 0.0 - 0.2 x10E3/uL   Immature Granulocytes 0 Not Estab. %   Immature Grans (Abs) 0.0 0.0 - 0.1 x10E3/uL  H. 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 Await their input. She does have somatic dysfunction that is contributing to her symptoms. Treated today as below. Continue to monitor. Call with any concerns.       Other Visit Diagnoses     Diarrhea, unspecified type       Will check labs and stool studies if not improving. Await results.   Relevant Orders   Urinalysis, Routine w reflex microscopic (Completed)   Comprehensive metabolic panel (Completed)   CBC with Differential/Platelet (Completed)   Fecal occult blood, imunochemical([facility]) (Completed)   Stool C-Diff Toxin Assay   H. pylori antigen,  stool (Completed)   Stool Culture (Completed)   Fecal leukocytes (Completed)   Ova and parasite examination   Head region somatic dysfunction       Cervical segment dysfunction       Thoracic segment dysfunction       Somatic dysfunction of lumbar region       Somatic dysfunction of sacral region       Somatic dysfunction of pelvis region       Rib cage region somatic dysfunction       Segmental dysfunction of abdomen          After verbal consent was obtained, patient was treated today with osteopathic manipulative medicine to the regions of the head, neck, thorax, ribs, lumbar, pelvis, sacrum, and abdomen using the techniques of cranial, myofascial release, counterstrain, muscle energy, HVLA, and soft tissue. Areas of compensation relating to her primary pain source also treated. Patient tolerated the procedure well with good objective and good subjective improvement in symptoms. She left the room in good condition. She was advised to stay well hydrated and that she may have some soreness following the procedure. If not improving or worsening, she will call and come in. She will return for reevaluation  on a PRN basis.   Follow up plan: Return if  symptoms worsen or fail to improve.

## 2023-06-01 LAB — COMPREHENSIVE METABOLIC PANEL
ALT: 23 IU/L (ref 0–32)
AST: 20 IU/L (ref 0–40)
Albumin: 4.5 g/dL (ref 3.9–4.9)
Alkaline Phosphatase: 77 IU/L (ref 44–121)
BUN/Creatinine Ratio: 19 (ref 12–28)
BUN: 15 mg/dL (ref 8–27)
Bilirubin Total: 0.3 mg/dL (ref 0.0–1.2)
CO2: 23 mmol/L (ref 20–29)
Calcium: 9.9 mg/dL (ref 8.7–10.3)
Chloride: 95 mmol/L — ABNORMAL LOW (ref 96–106)
Creatinine, Ser: 0.77 mg/dL (ref 0.57–1.00)
Globulin, Total: 2.3 g/dL (ref 1.5–4.5)
Glucose: 104 mg/dL — ABNORMAL HIGH (ref 70–99)
Potassium: 4.3 mmol/L (ref 3.5–5.2)
Sodium: 136 mmol/L (ref 134–144)
Total Protein: 6.8 g/dL (ref 6.0–8.5)
eGFR: 87 mL/min/{1.73_m2} (ref >59)

## 2023-06-01 LAB — CBC WITH DIFFERENTIAL/PLATELET
Basophils Absolute: 0.1 10*3/uL (ref 0.0–0.2)
Basos: 1 %
EOS (ABSOLUTE): 0.1 10*3/uL (ref 0.0–0.4)
Eos: 1 %
Hematocrit: 37.1 % (ref 34.0–46.6)
Hemoglobin: 12 g/dL (ref 11.1–15.9)
Immature Grans (Abs): 0 10*3/uL (ref 0.0–0.1)
Immature Granulocytes: 0 %
Lymphocytes Absolute: 2 10*3/uL (ref 0.7–3.1)
Lymphs: 29 %
MCH: 27.5 pg (ref 26.6–33.0)
MCHC: 32.3 g/dL (ref 31.5–35.7)
MCV: 85 fL (ref 79–97)
Monocytes Absolute: 0.5 10*3/uL (ref 0.1–0.9)
Monocytes: 7 %
Neutrophils Absolute: 4.2 10*3/uL (ref 1.4–7.0)
Neutrophils: 62 %
Platelets: 496 10*3/uL — ABNORMAL HIGH (ref 150–450)
RBC: 4.36 x10E6/uL (ref 3.77–5.28)
RDW: 13.2 % (ref 11.7–15.4)
WBC: 6.9 10*3/uL (ref 3.4–10.8)

## 2023-06-01 NOTE — Telephone Encounter (Signed)
Spoke with patient rescheduled to 8/29 at 9:45 am.

## 2023-06-02 LAB — FECAL OCCULT BLOOD, IMMUNOCHEMICAL: Fecal Occult Bld: NEGATIVE

## 2023-06-03 ENCOUNTER — Encounter: Payer: Self-pay | Admitting: Family Medicine

## 2023-06-03 LAB — FECAL LEUKOCYTES

## 2023-06-03 LAB — STOOL CULTURE

## 2023-06-03 LAB — H. PYLORI ANTIGEN, STOOL: H pylori Ag, Stl: NEGATIVE

## 2023-06-03 LAB — OVA AND PARASITE EXAMINATION

## 2023-06-03 NOTE — Assessment & Plan Note (Signed)
To see neurosurgery. Await their input. She does have somatic dysfunction that is contributing to her symptoms. Treated today as below. Continue to monitor. Call with any concerns.

## 2023-06-04 ENCOUNTER — Encounter: Payer: Self-pay | Admitting: Family Medicine

## 2023-06-04 LAB — FECAL LEUKOCYTES

## 2023-06-04 LAB — STOOL CULTURE: E coli, Shiga toxin Assay: NEGATIVE

## 2023-06-04 LAB — CLOSTRIDIUM DIFFICILE EIA: C difficile Toxins A+B, EIA: NEGATIVE

## 2023-06-05 LAB — OVA AND PARASITE EXAMINATION

## 2023-06-05 LAB — STOOL CULTURE

## 2023-06-14 NOTE — Progress Notes (Unsigned)
Referring Physician:  Dorcas Carrow, DO [address] [place],  [place] [address]  Primary Physician:  Kaitlyn Carrow, DO  History of Present Illness: 06/14/2023*** [name] has a history of HTN, asthma, OSA, hyperlipidemia, and chronic pain.   She is seeing [facility]. Saw PCP on 06/16/23 for pain- was given prednisone and ultram.   History of lumbar fusion L5-S1. History of cervical disc arthoplasty C4-C5 and ACDF C6-C7.     Duration: *** Location: *** Quality: *** Severity: ***  Precipitating: aggravated by *** Modifying factors: made better by *** Weakness: none Timing: *** Bowel/Bladder Dysfunction: none  Conservative measures:  Physical therapy: had osteopathic manipulative medicine done by PCP, she is doing PT?*** Multimodal medical therapy including regular antiinflammatories: ultram, lyrcia, cymbalta, diclofenac Injections: *** epidural steroid injections  Past Surgery:  History of lumbar fusion L5-S1 History of cervical disc arthoplasty C4-C5 and ACDF C6-C7.   Kaitlyn Frazier has ***no symptoms of cervical myelopathy.  The symptoms are causing a significant impact on the patient's life.   Review of Systems:  A 10 point review of systems is negative, except for the pertinent positives and negatives detailed in the HPI.  Past Medical History: Past Medical History:  Diagnosis Date   Abnormal vaginal Pap smear    Depression    Fibromyalgia    hx of Conversion disorder    hx of Peritonitis (HCC)    Mitral valve regurgitation    Sleep apnea    Thyroid nodule     Past Surgical History: Past Surgical History:  Procedure Laterality Date   ABDOMINAL HYSTERECTOMY     ANTERIOR / POSTERIOR COMBINED FUSION CERVICAL SPINE     ANTERIOR LAT LUMBAR FUSION     BREAST BIOPSY N/A    pt unsure which breast 10 years ago-fibrous tissue   CESAREAN SECTION     INGUINAL HERNIA REPAIR     NOSE SURGERY     TUMOR REMOVAL     on her SI joint     Allergies: Allergies as of 06/21/2023 - Review Complete 06/03/2023  Allergen Reaction Noted   Gabapentin Other (See Comments), Hives, and Rash 04/16/2022   Amitriptyline Other (See Comments) 04/16/2022   Baclofen Other (See Comments) 04/16/2022   Cyclobenzaprine Other (See Comments) 04/16/2022   Doxepin  11/24/2022   Latex Hives 04/16/2022   Norflex tablets [orphenadrine]  05/24/2022   Pollen extract Itching 04/16/2022   Tizanidine Other (See Comments) 08/04/2022   Other Rash 04/16/2022    Medications: Outpatient Encounter Medications as of 06/21/2023  Medication Sig   albuterol (VENTOLIN HFA) 108 (90 Base) MCG/ACT inhaler Inhale 1-2 puffs into the lungs every 6 (six) hours as needed.   buPROPion (WELLBUTRIN SR) 150 MG 12 hr tablet Take 1 tablet (150 mg total) by mouth 2 (two) times daily.   Cholecalciferol (VITAMIN D-3) 25 MCG (1000 UT) CAPS Take by mouth.   diazepam (VALIUM) 10 MG tablet Take 1 tablet (10 mg total) by mouth at bedtime as needed for anxiety.   diclofenac (VOLTAREN) 50 MG EC tablet 1 tablet as needed Orally Twice a day for 30 days   DULoxetine (CYMBALTA) 60 MG capsule Take 1 capsule (60 mg total) by mouth daily.   estradiol (ESTRACE) 0.1 MG/GM vaginal cream Place vaginally.   hydrochlorothiazide (HYDRODIURIL) 25 MG tablet Take 1 tablet (25 mg total) by mouth daily.   hydrOXYzine (ATARAX) 50 MG tablet Take 0.5-1 tablets (25-50 mg total) by mouth at bedtime.  metroNIDAZOLE (METROGEL) 1 % gel Apply topically daily.   omeprazole (PRILOSEC) 20 MG capsule Take 1 capsule (20 mg total) by mouth daily.   ondansetron (ZOFRAN) 4 MG tablet Take 1 tablet (4 mg total) by mouth every 8 (eight) hours as needed for nausea or vomiting.   pregabalin (LYRICA) 150 MG capsule Take 1 capsule (150 mg total) by mouth daily.   zolpidem (AMBIEN) 5 MG tablet Take 1 tablet (5 mg total) by mouth at bedtime as needed for sleep. (Patient not taking: Reported on 05/31/2023)   No  facility-administered encounter medications on file as of 06/21/2023.    Social History: Social History   Tobacco Use   Smoking status: Never    Passive exposure: Never   Smokeless tobacco: Never  Vaping Use   Vaping Use: Some days   Substances: Nicotine  Substance Use Topics   Alcohol use: Never   Drug use: Never    Family Medical History: Family History  Problem Relation Age of Onset   Alcohol abuse Mother    Bipolar disorder Mother    Uterine cancer Paternal Aunt        55s   Ovarian cancer Paternal Aunt        66s   Ovarian cancer Paternal Aunt        57s   Ovarian cancer Cousin        58s   Tourette syndrome Son     Physical Examination: There were no vitals filed for this visit.  General: Patient is well developed, well nourished, calm, collected, and in no apparent distress. Attention to examination is appropriate.  Respiratory: Patient is breathing without any difficulty.   NEUROLOGICAL:     Awake, alert, oriented to person, place, and time.  Speech is clear and fluent. Fund of knowledge is appropriate.   Cranial Nerves: Pupils equal round and reactive to light.  Facial tone is symmetric.    *** ROM of cervical spine *** pain *** posterior cervical tenderness. *** tenderness in bilateral trapezial region.   *** ROM of lumbar spine *** pain *** posterior lumbar tenderness.   No abnormal lesions on exposed skin.   Strength: Side Biceps Triceps Deltoid Interossei Grip Wrist Ext. Wrist Flex.  R 5 5 5 5 5 5 5   L 5 5 5 5 5 5 5    Side Iliopsoas Quads Hamstring PF DF EHL  R 5 5 5 5 5 5   L 5 5 5 5 5 5    Reflexes are ***2+ and symmetric at the biceps, brachioradialis, patella and achilles.   Hoffman's is absent.  Clonus is not present.   Bilateral upper and lower extremity sensation is intact to light touch.     Gait is normal.   ***No difficulty with tandem gait.    Medical Decision Making  Imaging: MRI of cervical spine dated 05/13/23:   FINDINGS: Alignment: Straightening of cervical lordosis, not significantly changed from the February radiographs. Mild degenerative anterolisthesis of C7-T1 ( 2-3 mm).   Vertebrae: C4-C5 disc arthroplasty and C6-C7 anterior interbody fusion implant. Moderate hardware artifact at the former. Mild susceptibility artifact at the latter and evidence of solid interbody arthrodesis there.   Background bone marrow signal is within normal limits. No marrow edema or evidence of acute osseous abnormality.   Cord: No spinal cord signal abnormality despite degenerative lower cervical cord mass effect which is detailed below.   Visible upper thoracic spinal canal and cord appear negative.   Posterior Fossa, vertebral arteries, paraspinal  tissues: Cervicomedullary junction is within normal limits. Negative visible posterior fossa. Preserved major vascular flow voids in the neck with mildly dominant right vertebral artery. Negative visible neck soft tissues, lung apices.   Disc levels:   C2-C3: Negative disc. Mild to moderate facet and ligament flavum hypertrophy. Mild left C3 foraminal stenosis.   C3-C4: Negative disc. Mild foraminal endplate spurring, facet hypertrophy. Mild bilateral C4 neural foraminal stenosis.   C4-C5: Disc arthroplasty. Hardware artifact limits detail at this level. Mild to moderate facet hypertrophy is evident. Probably no spinal or high-grade foraminal stenosis.   C5-C6: Disc space loss with circumferential disc osteophyte complex. Posterior broad-based right paracentral component of disc (series 19, image 19). Mild facet hypertrophy. Mild to moderate spinal stenosis and right hemi cord mass effect. Mild right C6 foraminal stenosis.   C6-C7: ACDF with evidence of solid interbody arthrodesis. Left paracentral endplate osteophyte (series 19, image 25), continuing into the left neural foramen. Mild spinal stenosis and left hemi cord mass effect. Moderate left C7  foraminal stenosis.   C7-T1: Mild anterolisthesis. Moderate to severe facet hypertrophy (on the left series 16, image 12). Mild leftward disc bulging/pseudo disc. No spinal stenosis. But moderate left C8 foraminal stenosis (series 16, image 13).   Mild upper thoracic facet hypertrophy without stenosis.   IMPRESSION: 1. C4-C5 disc arthroplasty with moderate susceptibility artifact limiting detail at that level. Probably no spinal or high-grade foraminal stenosis.   2. C6-C7 ACDF with evidence of solid interbody arthrodesis, but residual leftward osteophytosis resulting in mild spinal stenosis and moderate left C7 foraminal stenosis. Mild left hemi cord mass effect.   3. 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 Up to moderate spinal stenosis and right hemi cord mass effect, but no cord signal abnormality. Only mild right C6 foraminal stenosis.   4. Mild anterolisthesis at C7-T1 with moderate to severe facet degeneration and moderate left C8 neural foraminal stenosis.     Electronically Signed   By: Odessa Fleming M.D.   On: 05/25/2023 06:55  I have personally reviewed the images and agree with the above interpretation.  Assessment and Plan: Kaitlyn Frazier is a pleasant 62 y.o. female has ***  Treatment options discussed with patient and following plan made:   - Order for physical therapy for *** spine ***. Patient to call to schedule appointment. *** - Continue current medications including ***. Reviewed dosing and side effects.  - Prescription for ***. Reviewed dosing and side effects. Take with food.  - Prescription for *** to take prn muscle spasms. Reviewed dosing and side effects. Discussed this can cause drowsiness.  - MRI of *** to further evaluate *** radiculopathy. No improvement time or medications (***).  - Referral to [facility] at [facility] to discuss possible *** injections.  - Will schedule phone visit to review MRI results once I get them back.    I spent a total of *** minutes in face-to-face and non-face-to-face activities related to this patient's care today including review of outside records, review of imaging, review of symptoms, physical exam, discussion of differential diagnosis, discussion of treatment options, and documentation.   Thank you for involving me in the care of this patient.   Drake Leach PA-C Dept. of Neurosurgery

## 2023-06-15 ENCOUNTER — Encounter: Payer: Self-pay | Admitting: Family Medicine

## 2023-06-16 ENCOUNTER — Encounter: Payer: Self-pay | Admitting: Physician Assistant

## 2023-06-16 ENCOUNTER — Ambulatory Visit: Payer: Medicare Other | Admitting: Physician Assistant

## 2023-06-16 VITALS — BP 132/79 | HR 85 | Ht 63.0 in | Wt 186.8 lb

## 2023-06-16 DIAGNOSIS — L719 Rosacea, unspecified: Secondary | ICD-10-CM

## 2023-06-16 DIAGNOSIS — M25512 Pain in left shoulder: Secondary | ICD-10-CM | POA: Diagnosis not present

## 2023-06-16 DIAGNOSIS — M4722 Other spondylosis with radiculopathy, cervical region: Secondary | ICD-10-CM | POA: Diagnosis not present

## 2023-06-16 MED ORDER — PREDNISONE 20 MG PO TABS
ORAL_TABLET | ORAL | 0 refills | Status: DC
Start: 2023-06-16 — End: 2023-06-21

## 2023-06-16 MED ORDER — TRAMADOL HCL 50 MG PO TABS
100.0000 mg | ORAL_TABLET | Freq: Two times a day (BID) | ORAL | 0 refills | Status: DC | PRN
Start: 2023-06-16 — End: 2023-06-21

## 2023-06-16 MED ORDER — NALOXONE HCL 4 MG/0.1ML NA LIQD: NASAL | 1 refills | Status: DC

## 2023-06-16 MED ORDER — METRONIDAZOLE 1 % EX GEL: Freq: Every day | CUTANEOUS | 3 refills | Status: DC

## 2023-06-16 NOTE — Telephone Encounter (Signed)
Appt with someone in the office

## 2023-06-16 NOTE — Assessment & Plan Note (Signed)
Refill provided today

## 2023-06-16 NOTE — Telephone Encounter (Signed)
Called and scheduled patient on 06/16/2023 @ 2:00 pm.

## 2023-06-16 NOTE — Patient Instructions (Addendum)
You can try Voltaren gel - this is a topical NSAID that can help with pain   Please try to take the Prednisone taper first and use the Tramadol only as needed for more severe pain   DO NOT USE TRAMADOL WITH AMBIEN OR OTHER SEDATING MEDICATIONS. THIS CAN CAUSE YOU TO FALL ASLEEP AND STOP BREATHING

## 2023-06-16 NOTE — Progress Notes (Signed)
Acute Office Visit   Patient: Kaitlyn Frazier   DOB: 1961-09-26   62 y.o. Female  MRN: [id number] Visit Date: 06/16/2023  Today's healthcare provider: Oswaldo Conroy Kaylany Tesoriero, PA-C  Introduced myself to the patient as a Secondary school teacher and provided education on APPs in clinical practice.    Chief Complaint  Patient presents with   Headache   Neck Pain    Patient says she has a pinched nerve in her neck and says she is seeing Neurosurgeon on Tuesday and says she does not think she can make it until then. Patient says she has taking Diclofenac and NSAID as it was messing with her stomach. Patient says she has been taking Cymbalta, Lyrica, Tylenol and Muscle Relaxer from [facility] and says nothing is helping her.    Pain   Subjective    HPI HPI     Neck Pain    Additional comments: Patient says she has a pinched nerve in her neck and says she is seeing Neurosurgeon on Tuesday and says she does not think she can make it until then. Patient says she has taking Diclofenac and NSAID as it was messing with her stomach. Patient says she has been taking Cymbalta, Lyrica, Tylenol and Muscle Relaxer from [facility] and says nothing is helping her.       Last edited by Malen Gauze, CMA on 06/16/2023  2:11 PM.       Neck pain, headache   She reports slipped disc in cervical spine She states she is now having pain in her neck, upper back and shoulder She was taking Diclofenac but states that was causing GI upset (was having cramping, sharp pains in abdomen)  She is still taking her Cymbalta, lyrica, tylenol, muscle relaxer and red pepper patches  Duration: chronic and ongoing  Location: left side of neck, left shoulder, left side of her back  Pain level: 10/10  Pain level and character: feels like an ice pick stabbing pain then developed into a deep itch  She reports swelling and tightness in her left shoulder and left side of her neck   She has an apt with Neurosurgery on Tuesday but is  concerned about managing her pain until this apt   eGFR > 60, gastric upset with NSAID   She states she has tried lidocaine patches in the past but they did not provide much relief  She reports toradol IM has caused severe stomach upset   She states she is not using Valium - states this was a short script to help with dental work and she no longer has any in home  She states she did take Ambien 10 mg last night to help with sleep due to the pain    Medications: Outpatient Medications Prior to Visit  Medication Sig   albuterol (VENTOLIN HFA) 108 (90 Base) MCG/ACT inhaler Inhale 1-2 puffs into the lungs every 6 (six) hours as needed.   buPROPion (WELLBUTRIN SR) 150 MG 12 hr tablet Take 1 tablet (150 mg total) by mouth 2 (two) times daily.   Cholecalciferol (VITAMIN D-3) 25 MCG (1000 UT) CAPS Take by mouth.   DULoxetine (CYMBALTA) 60 MG capsule Take 1 capsule (60 mg total) by mouth daily.   estradiol (ESTRACE) 0.1 MG/GM vaginal cream Place vaginally.   hydrochlorothiazide (HYDRODIURIL) 25 MG tablet Take 1 tablet (25 mg total) by mouth daily.   hydrOXYzine (ATARAX) 50 MG tablet Take 0.5-1 tablets (25-50 mg total) by  mouth at bedtime.   omeprazole (PRILOSEC) 20 MG capsule Take 1 capsule (20 mg total) by mouth daily.   ondansetron (ZOFRAN) 4 MG tablet Take 1 tablet (4 mg total) by mouth every 8 (eight) hours as needed for nausea or vomiting.   pregabalin (LYRICA) 150 MG capsule Take 1 capsule (150 mg total) by mouth daily.   zolpidem (AMBIEN) 5 MG tablet Take 1 tablet (5 mg total) by mouth at bedtime as needed for sleep.   [DISCONTINUED] diazepam (VALIUM) 10 MG tablet Take 1 tablet (10 mg total) by mouth at bedtime as needed for anxiety.   [DISCONTINUED] metroNIDAZOLE (METROGEL) 1 % gel Apply topically daily.   diclofenac (VOLTAREN) 50 MG EC tablet 1 tablet as needed Orally Twice a day for 30 days (Patient not taking: Reported on 06/16/2023)   No facility-administered medications prior to  visit.    Review of Systems  Musculoskeletal:  Positive for neck pain.  Neurological:  Positive for headaches.       Objective    BP 132/79   Pulse 85   Ht 5\' 3"  (1.6 m)   Wt 186 lb 12.8 oz (84.7 kg)   SpO2 97%   BMI 33.09 kg/m    Physical Exam Vitals reviewed.  Constitutional:      General: She is awake.     Appearance: Normal appearance. She is well-developed and well-groomed.  HENT:     Head: Normocephalic and atraumatic.  Pulmonary:     Effort: Pulmonary effort is normal.  Musculoskeletal:     Right shoulder: Normal. No swelling. Normal range of motion.     Left shoulder: Swelling present. Decreased range of motion.     Cervical back: Swelling and spasms present. Pain with movement present. Decreased range of motion.  Neurological:     Mental Status: She is alert.  Psychiatric:        Attention and Perception: Attention and perception normal.        Mood and Affect: Mood and affect normal.        Speech: Speech normal.        Behavior: Behavior normal. Behavior is cooperative.       No results found for any visits on 06/16/23.  Assessment & Plan      No follow-ups on file.        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    Chronic, historic problem, currently exacerbated  She reports she has not been able to take her Diclofec and has had increased symptoms of pain and swelling to left side of neck and left shoulder She has apt with Neurosurgery next week but is worried about pain management until then She is not amenable to toradol injection and does not wish to try another NSAID  Reviewed using Prednisone taper for inflammation  Will provide limited supply of Tramadol 50 -100 mg PO BID PRN (6 tablets) to assist with pain management Reviewed contraindications to concurrent sedating substances- Narcan provided as well along with admin instructions.   Will defer to Neurosurgery recommendations once she is evaluated there. Follow up as needed for persistent or progressing symptoms         Relevant Medications   predniSONE (DELTASONE) 20 MG tablet   traMADol (ULTRAM) 50 MG tablet     Other   Rosacea    Refill provided today  Relevant Medications   metroNIDAZOLE (METROGEL) 1 % gel   Pain in joint of left shoulder   Relevant Medications   predniSONE (DELTASONE) 20 MG tablet   traMADol (ULTRAM) 50 MG tablet     No follow-ups on file.   I, Evonne Rinks E Elloise Roark, PA-C, have reviewed all documentation for this visit. The documentation on 06/16/23 for the exam, diagnosis, procedures, and orders are all accurate and complete.   Jacquelin Hawking, MHS, PA-C [facility]

## 2023-06-16 NOTE — Assessment & Plan Note (Addendum)
Chronic, historic problem, currently exacerbated  She reports she has not been able to take her Diclofec and has had increased symptoms of pain and swelling to left side of neck and left shoulder She has apt with Neurosurgery next week but is worried about pain management until then She is not amenable to toradol injection and does not wish to try another NSAID  Reviewed using Prednisone taper for inflammation  Will provide limited supply of Tramadol 50 -100 mg PO BID PRN (6 tablets) to assist with pain management Reviewed contraindications to concurrent sedating substances- Narcan provided as well along with admin instructions.  Will defer to Neurosurgery recommendations once she is evaluated there. Follow up as needed for persistent or progressing symptoms

## 2023-06-21 ENCOUNTER — Ambulatory Visit: Payer: Medicare Other | Admitting: Orthopedic Surgery

## 2023-06-21 ENCOUNTER — Ambulatory Visit: Payer: Self-pay

## 2023-06-21 ENCOUNTER — Other Ambulatory Visit: Payer: Self-pay | Admitting: Family Medicine

## 2023-06-21 ENCOUNTER — Encounter: Payer: Self-pay | Admitting: Orthopedic Surgery

## 2023-06-21 VITALS — BP 130/82 | Ht 63.0 in | Wt 185.6 lb

## 2023-06-21 DIAGNOSIS — Z981 Arthrodesis status: Secondary | ICD-10-CM

## 2023-06-21 DIAGNOSIS — M4802 Spinal stenosis, cervical region: Secondary | ICD-10-CM | POA: Diagnosis not present

## 2023-06-21 DIAGNOSIS — W19XXXA Unspecified fall, initial encounter: Secondary | ICD-10-CM

## 2023-06-21 DIAGNOSIS — M47812 Spondylosis without myelopathy or radiculopathy, cervical region: Secondary | ICD-10-CM

## 2023-06-21 DIAGNOSIS — M4722 Other spondylosis with radiculopathy, cervical region: Secondary | ICD-10-CM | POA: Diagnosis not present

## 2023-06-21 MED ORDER — LIDOCAINE 5 % EX PTCH
1.0000 | MEDICATED_PATCH | Freq: Two times a day (BID) | CUTANEOUS | 0 refills | Status: DC
Start: 2023-06-21 — End: 2023-12-13

## 2023-06-21 NOTE — Patient Instructions (Signed)
It was so nice to see you today. Thank you so much for coming in.    You have some wear and tear in your neck. Dr. Myer Haff thinks your pain may be coming some irritation of the nerve on the left at C6-C7.   I ordered xrays of your neck. You can get these at [facility] (building with the white pillars) off of [address]. The address is [address] [place], [place] [address]. You do not need any appointment.  I sent a prescription for lidoderm patches to the pharmacy. Put a patch on for 12 hours then remove it for 12 hours. If insurance does not cover this, you can get them over the counter (the aren't quite as strong).   I want you to see physical medicine and rehab at the [facility] to discuss possible injections in your neck. Dr. Yves Dill, Dr. Mariah Milling, and their PA Alphonzo Lemmings are great and will take good care of you. They should call you to schedule an appointment or you can call them at [phone]. I will send them a message to see if they can get you seen as quickly as possible.   If pain becomes intolerable, I recommend going to the emergency room.   We will see you back in 6 weeks for a recheck, but please do not hesitate to call if you have any questions or concerns. You can also message me in MyChart.   Drake Leach PA-C [phone]

## 2023-06-21 NOTE — Telephone Encounter (Signed)
This is not something we can do over the phone. Will need appt

## 2023-06-21 NOTE — Telephone Encounter (Signed)
  Chief Complaint: Back pain Symptoms: above Frequency: Ongoing Pertinent Negatives: Patient denies  Disposition: [x] ED /[] Urgent Care (no appt availability in office) / [] Appointment(In office/virtual)/ []  [place]/ [] Home Care/ [x] Refused Recommended Disposition /[] [place] [facility]/ []  Follow-up with PCP Additional Notes: Pt called with extreme ongoing back pain. Pt was seen at neurology today. Per pt they do not give pain medications and referred her back to her PCP for pain meds. Pt states she is taking medication as prescribed, using lidocaine and capsaicin patches, and has a TENS unit ordered. Neurologist's office suggested that pt needs a stronger muscle relaxer and stronger pain medication.  Advise pt that ED is available for pain medication as well. PT will go to ED if no response from PCP.  Pt is very frustrated. Please advise.     Reason for Disposition  [1] SEVERE abdominal pain AND [2] present > 1 hour  Answer Assessment - Initial Assessment Questions 1. ONSET: "When did the pain begin?"      Ongoing 2. LOCATION: "Where does it hurt?" (upper, mid or lower back)     Back 3. SEVERITY: "How bad is the pain?"  (e.g., Scale 1-10; mild, moderate, or severe)   - MILD (1-3): Doesn't interfere with normal activities.    - MODERATE (4-7): Interferes with normal activities or awakens from sleep.    - SEVERE (8-10): Excruciating pain, unable to do any normal activities.      severe  Protocols used: Back Pain-A-AH

## 2023-06-22 MED ORDER — PREGABALIN 150 MG PO CAPS: 150.0000 mg | ORAL_CAPSULE | Freq: Three times a day (TID) | ORAL | 3 refills | Status: DC

## 2023-06-22 NOTE — Telephone Encounter (Signed)
Please call patient- see telephone message about the same topic.

## 2023-06-22 NOTE — Telephone Encounter (Addendum)
We do not prescribe controlled substances without an appointment. I'm sorry. I can double book her today but if she's in that much pain I do advise her to go to the ER. She can however, go up on her lyrica and take 1 pill 2x a day for 1 week and then 1 3x a day- I'll call the higher dose in for her

## 2023-06-22 NOTE — Telephone Encounter (Signed)
Called patient to inform her of provider instructions. Patient expressed understanding to increase medication per provider. Will keep scheduled appt.

## 2023-06-22 NOTE — Telephone Encounter (Signed)
Requested medication (s) are due for refill today: yes  Requested medication (s) are on the active medication list: yes  Last refill:  05/01/23  Future visit scheduled: yes  Notes to clinic:  Unable to refill per protocol, cannot delegate.      Requested Prescriptions  Pending Prescriptions Disp Refills   zolpidem (AMBIEN) 5 MG tablet [Pharmacy Med Name: ZOLPIDEM 5MG  TABLETS] 30 tablet     Sig: TAKE 1 TABLET(5 MG) BY MOUTH AT BEDTIME AS NEEDED FOR SLEEP     Not Delegated - Psychiatry:  Anxiolytics/Hypnotics Failed - 06/21/2023  3:27 PM      Failed - This refill cannot be delegated      Failed - Urine Drug Screen completed in last 360 days      Passed - Valid encounter within last 6 months    Recent Outpatient Visits           6 days ago Osteoarthritis of spine with radiculopathy, cervical region   [facility] [place], Erin E, PA-C   3 weeks ago Osteoarthritis of spine with radiculopathy, cervical region   [facility] [place], Megan P, DO   1 month ago Insomnia, unspecified type   [place] [place], Megan P, DO   2 months ago PTSD (post-traumatic stress disorder)   [facility] [place], Megan P, DO   2 months ago Neoplasm of uncertain behavior of skin   [facility] [place], Oralia Rud, DO       Future Appointments             In 1 week Dorcas Carrow, DO [facility] [facility]   In 1 month Celso Amy, PA-C [facility]

## 2023-06-22 NOTE — Telephone Encounter (Signed)
Called patient to inform her that she needs an appt per provider. She states that she was seen on 7/ 11 for the same issue. She states  the pain is unbearable  and does not want to go to ER because the she is only given medication for a day and then back to square one.Patient  does not think that she can make it next scheduled appt. Please advise.

## 2023-06-22 NOTE — Addendum Note (Signed)
Addended by: Dorcas Carrow on: 06/22/2023 09:22 AM   Modules accepted: Orders

## 2023-06-24 ENCOUNTER — Encounter: Payer: Self-pay | Admitting: Family Medicine

## 2023-06-24 ENCOUNTER — Encounter: Payer: Self-pay | Admitting: Nurse Practitioner

## 2023-06-30 ENCOUNTER — Ambulatory Visit: Payer: Self-pay | Admitting: *Deleted

## 2023-06-30 NOTE — Telephone Encounter (Signed)
  Chief Complaint: requesting medication soma  Symptoms: worsening neck pain, spasms "can hardly take it", waiting on injection to be given  Frequency: na Pertinent Negatives: Patient denies na  Disposition: [] ED /[] Urgent Care (no appt availability in office) / [] Appointment(In office/virtual)/ []  [place] [facility]/ [] Home Care/ [x] Refused Recommended Disposition /[] [place] [facility]/ []  Follow-up with PCP Additional Notes:   Recommended ED due to severe pain and spasms. Patient declined and reports PCP is aware of her issues and wants soma prescribed due to no relief in the past with Robaxin. Reports lidocaine patches prescribed were not covered by her insurance and never tried . No available appt tomorrow.     Reason for Disposition  [1] SEVERE neck pain (e.g., excruciating, unable to do any normal activities) AND [2] not improved after 2 hours of pain medicine  Answer Assessment - Initial Assessment Questions 1. ONSET: "When did the pain begin?"      On going  2. LOCATION: "Where does it hurt?"      Neck  3. PATTERN "Does the pain come and go, or has it been constant since it started?"      constant 4. SEVERITY: "How bad is the pain?"  (Scale 1-10; or mild, moderate, severe)   - NO PAIN (0): no pain or only slight stiffness    - MILD (1-3): doesn't interfere with normal activities    - MODERATE (4-7): interferes with normal activities or awakens from sleep    - SEVERE (8-10):  excruciating pain, unable to do any normal activities      Worsening pain and "can hardly stand it" 5. RADIATION: "Does the pain go anywhere else, shoot into your arms?"     Na  6. CORD SYMPTOMS: "Any weakness or numbness of the arms or legs?"     Na  7. CAUSE: "What do you think is causing the neck pain?"     Reports PCP is aware  8. NECK OVERUSE: "Any recent activities that involved turning or twisting the neck?"     na 9. OTHER SYMPTOMS: "Do you have any other symptoms?" (e.g., headache,  fever, chest pain, difficulty breathing, neck swelling)     Neck pain spasms worse  10. PREGNANCY: "Is there any chance you are pregnant?" "When was your last menstrual period?"       na  Protocols used: Neck Pain or Stiffness-A-AH

## 2023-07-01 MED ORDER — CARISOPRODOL 250 MG PO TABS: 250.0000 mg | ORAL_TABLET | Freq: Four times a day (QID) | ORAL | 0 refills | Status: DC | PRN

## 2023-07-04 ENCOUNTER — Telehealth: Payer: Self-pay | Admitting: Family Medicine

## 2023-07-04 ENCOUNTER — Ambulatory Visit: Payer: Medicare Other | Admitting: Family Medicine

## 2023-07-04 ENCOUNTER — Encounter: Payer: Self-pay | Admitting: Family Medicine

## 2023-07-04 VITALS — BP 133/80 | HR 93 | Temp 98.0°F | Wt 186.4 lb

## 2023-07-04 DIAGNOSIS — I10 Essential (primary) hypertension: Secondary | ICD-10-CM | POA: Diagnosis not present

## 2023-07-04 DIAGNOSIS — M4722 Other spondylosis with radiculopathy, cervical region: Secondary | ICD-10-CM | POA: Diagnosis not present

## 2023-07-04 DIAGNOSIS — F331 Major depressive disorder, recurrent, moderate: Secondary | ICD-10-CM

## 2023-07-04 DIAGNOSIS — F4310 Post-traumatic stress disorder, unspecified: Secondary | ICD-10-CM

## 2023-07-04 DIAGNOSIS — F411 Generalized anxiety disorder: Secondary | ICD-10-CM

## 2023-07-04 MED ORDER — MELOXICAM 15 MG PO TABS: 15.0000 mg | ORAL_TABLET | Freq: Every day | ORAL | 1 refills | Status: DC

## 2023-07-04 MED ORDER — DULOXETINE HCL 60 MG PO CPEP
60.0000 mg | ORAL_CAPSULE | Freq: Every day | ORAL | 0 refills | Status: DC
Start: 2023-07-04 — End: 2023-12-09

## 2023-07-04 MED ORDER — TRAMADOL HCL ER 100 MG PO TB24: 100.0000 mg | ORAL_TABLET | Freq: Three times a day (TID) | ORAL | 0 refills | Status: DC | PRN

## 2023-07-04 MED ORDER — HYDROCHLOROTHIAZIDE 25 MG PO TABS: 25.0000 mg | ORAL_TABLET | Freq: Every day | ORAL | 1 refills | Status: DC

## 2023-07-04 MED ORDER — BUPROPION HCL ER (SR) 150 MG PO TB12: 150.0000 mg | ORAL_TABLET | Freq: Two times a day (BID) | ORAL | 0 refills | Status: DC

## 2023-07-04 NOTE — Telephone Encounter (Unsigned)
Copied from CRM [id number]. Topic: General - Other >> Jul 04, 2023 11:32 AM Everette C wrote: Reason for CRM: The patient has called to follow up on previous discussions related to their traMADol (ULTRAM-ER) 100 MG 24 hr tablet [[id number]] prescription   The patient has been told that the medication requires a prior auth but is also out of stock at their current pharmacy   [id number] [place], [place] - [address] AT [facility] OF [address] [place], [place] [address] Phone: [phone]  Fax: [phone]    Please contact the patient further when possible

## 2023-07-04 NOTE — Progress Notes (Unsigned)
BP 133/80   Pulse 93   Temp 98 F (36.7 C) (Oral)   Wt 186 lb 6.4 oz (84.6 kg)   SpO2 98%   BMI 33.02 kg/m    Subjective:    Patient ID: Kaitlyn Frazier, female    DOB: [birth year], 62 y.o.   MRN: [id number]  HPI: Kaitlyn Frazier is a 62 y.o. female  Chief Complaint  Patient presents with   Neck Pain   NECK PAIN FOLLOW UP- feels like the higher dose of the lyrica has helped with her nerve pain. She notes that her muscles are very tight. She feels like it is on her mind all the time. She is taking the voltaren, soma, lyrica, cymbalta, topical  Diagnosis: cervical DJD Status: worse Treatments attempted: rest, ice, heat, APAP, ibuprofen, aleve, muscle relaxer, physical therapy, HEP, and OMM  Compliant with recommended treatment: {Blank single:19197::"yes","no","average"} Relief with NSAIDs?:  {Blank single:19197::"No NSAIDs Taken","no","mild","moderate","significant"} Location:{Blank multiple:19196::"Right","Left","R>L","L>R","midline"} Duration:{Blank single:19197::"chronic","days","weeks","months"} Severity: {Blank single:19197::"mild","moderate","severe","1/10","2/10","3/10","4/10","5/10","6/10","7/10","8/10","9/10","10/10"} Quality: {Blank multiple:19196::"sharp","dull","aching","burning","cramping","ill-defined","itchy","pressure-like","pulling","shooting","sore","stabbing","tender","tearing","throbbing"} Frequency: {Blank single:19197::"constant","intermittent","occasional","rare","every few minutes","a few times a hour","a few times a day","a few times a week","a few times a month","a few times a year"} Radiation: {Blank single:19197::"none","yes","R arm","L arm","low back","headache"} Aggravating factors: {Blank multiple:19196::"none","lifting","movement","walking","laying","bending","prolonged sitting","coughing","valsalva","Pain increased with coughing/valsalva"} Alleviating factors: {Blank  multiple:19196::"nothing","rest","ice","heat","laying","NSAIDs","APAP","narcotics","muscle relaxer"} Weakness:  {Blank single:19197::"yes","no"} Paresthesias / decreased sensation:  {Blank single:19197::"yes","no"}  Fevers:  {Blank single:19197::"yes","no"}   Relevant past medical, surgical, family and social history reviewed and updated as indicated. Interim medical history since our last visit reviewed. Allergies and medications reviewed and updated.  Review of Systems  Constitutional: Negative.   Respiratory: Negative.    Cardiovascular: Negative.   Gastrointestinal: Negative.   Musculoskeletal:  Positive for back pain, myalgias, neck pain and neck stiffness. Negative for arthralgias, gait problem and joint swelling.  Skin: Negative.   Neurological:  Positive for numbness. Negative for dizziness, tremors, seizures, syncope, facial asymmetry, speech difficulty, weakness, light-headedness and headaches.  Psychiatric/Behavioral:  Positive for dysphoric mood. Negative for agitation, behavioral problems, confusion, decreased concentration, hallucinations, self-injury, sleep disturbance and suicidal ideas. The patient is nervous/anxious. The patient is not hyperactive.     Per HPI unless specifically indicated above     Objective:    BP 133/80   Pulse 93   Temp 98 F (36.7 C) (Oral)   Wt 186 lb 6.4 oz (84.6 kg)   SpO2 98%   BMI 33.02 kg/m   Wt Readings from Last 3 Encounters:  07/04/23 186 lb 6.4 oz (84.6 kg)  06/21/23 185 lb 9.6 oz (84.2 kg)  06/16/23 186 lb 12.8 oz (84.7 kg)    Physical Exam Vitals and nursing note reviewed.  Constitutional:      General: She is not in acute distress.    Appearance: Normal appearance. She is obese. She is not ill-appearing, toxic-appearing or diaphoretic.  HENT:     Head: Normocephalic and atraumatic.     Right Ear: External ear normal.     Left Ear: External ear normal.     Nose: Nose normal.     Mouth/Throat:     Mouth: Mucous  membranes are moist.     Pharynx: Oropharynx is clear.  Eyes:     General: No scleral icterus.       Right eye: No discharge.        Left eye: No discharge.     Extraocular Movements: Extraocular movements intact.     Conjunctiva/sclera: Conjunctivae normal.     Pupils: Pupils are equal, round, and reactive to light.  Cardiovascular:  Rate and Rhythm: Normal rate and regular rhythm.     Pulses: Normal pulses.     Heart sounds: Normal heart sounds. No murmur heard.    No friction rub. No gallop.  Pulmonary:     Effort: Pulmonary effort is normal. No respiratory distress.     Breath sounds: Normal breath sounds. No stridor. No wheezing, rhonchi or rales.  Chest:     Chest wall: No tenderness.  Musculoskeletal:        General: Normal range of motion.     Cervical back: Normal range of motion and neck supple.  Skin:    General: Skin is warm and dry.     Capillary Refill: Capillary refill takes less than 2 seconds.     Coloration: Skin is not jaundiced or pale.     Findings: No bruising, erythema, lesion or rash.  Neurological:     General: No focal deficit present.     Mental Status: She is alert and oriented to person, place, and time. Mental status is at baseline.  Psychiatric:        Mood and Affect: Mood normal.        Behavior: Behavior normal.        Thought Content: Thought content normal.        Judgment: Judgment normal.     Results for orders placed or performed in visit on 05/31/23  Fecal occult blood, imunochemical([facility])   Specimen: Stool   ST  Result Value Ref Range   Fecal Occult Bld Negative Negative  Fecal leukocytes   Specimen: Stool   ST     CD- [id number] V  Result Value Ref Range   White Blood Cells (WBC), Stool Final report None Seen   Result 1 Comment   Stool Culture   Specimen: Stool   ST     CD- [id number] V  Result Value Ref Range   Salmonella/Shigella Screen Final report    Stool Culture result 1 (RSASHR) Comment     Campylobacter Culture Final report    Stool Culture result 1 (CMPCXR) Comment    E coli, Shiga toxin Assay Negative Negative  Clostridium difficile EIA   ST     CD- [id number] V  Result Value Ref Range   C difficile Toxins A+B, EIA Negative Negative  Ova and parasite examination   ST     CD- [id number] V  Result Value Ref Range   OVA + PARASITE EXAM Final report    O&P result 1 Comment   Urinalysis, Routine w reflex microscopic  Result Value Ref Range   Specific Gravity, UA 1.010 1.005 - 1.030   pH, UA 7.0 5.0 - 7.5   Color, UA Yellow Yellow   Appearance Ur Clear Clear   Leukocytes,UA Negative Negative   Protein,UA Negative Negative/Trace   Glucose, UA Negative Negative   Ketones, UA Negative Negative   RBC, UA Negative Negative   Bilirubin, UA Negative Negative   Urobilinogen, Ur 0.2 0.2 - 1.0 mg/dL   Nitrite, UA Negative Negative   Microscopic Examination Comment   Comprehensive metabolic panel  Result Value Ref Range   Glucose 104 (H) 70 - 99 mg/dL   BUN 15 8 - 27 mg/dL   Creatinine, Ser 6.29 0.57 - 1.00 mg/dL   eGFR 87 >52 WU/XLK/4.40   BUN/Creatinine Ratio 19 12 - 28   Sodium 136 134 - 144 mmol/L   Potassium 4.3 3.5 - 5.2 mmol/L   Chloride 95 (L) 96 - 106 mmol/L   CO2 23  20 - 29 mmol/L   Calcium 9.9 8.7 - 10.3 mg/dL   Total Protein 6.8 6.0 - 8.5 g/dL   Albumin 4.5 3.9 - 4.9 g/dL   Globulin, Total 2.3 1.5 - 4.5 g/dL   Bilirubin Total 0.3 0.0 - 1.2 mg/dL   Alkaline Phosphatase 77 44 - 121 IU/L   AST 20 0 - 40 IU/L   ALT 23 0 - 32 IU/L  CBC with Differential/Platelet  Result Value Ref Range   WBC 6.9 3.4 - 10.8 x10E3/uL   RBC 4.36 3.77 - 5.28 x10E6/uL   Hemoglobin 12.0 11.1 - 15.9 g/dL   Hematocrit 16.1 09.6 - 46.6 %   MCV 85 79 - 97 fL   MCH 27.5 26.6 - 33.0 pg   MCHC 32.3 31.5 - 35.7 g/dL   RDW 04.5 40.9 - 81.1 %   Platelets 496 (H) 150 - 450 x10E3/uL   Neutrophils 62 Not Estab. %   Lymphs 29 Not Estab. %   Monocytes 7 Not Estab. %   Eos 1 Not Estab. %    Basos 1 Not Estab. %   Neutrophils Absolute 4.2 1.4 - 7.0 x10E3/uL   Lymphocytes Absolute 2.0 0.7 - 3.1 x10E3/uL   Monocytes Absolute 0.5 0.1 - 0.9 x10E3/uL   EOS (ABSOLUTE) 0.1 0.0 - 0.4 x10E3/uL   Basophils Absolute 0.1 0.0 - 0.2 x10E3/uL   Immature Granulocytes 0 Not Estab. %   Immature Grans (Abs) 0.0 0.0 - 0.1 x10E3/uL  H. pylori antigen, stool  Result Value Ref Range   H pylori Ag, Stl Negative Negative      Assessment & Plan:   Problem List Items Addressed This Visit   None    Follow up plan: No follow-ups on file.

## 2023-07-06 ENCOUNTER — Telehealth: Payer: Self-pay | Admitting: Family Medicine

## 2023-07-06 NOTE — Assessment & Plan Note (Signed)
Not doing well. Has not heard from psychiatry- will check on referral. Refills given for now. Discussed going to the ER if she feels like she needs to. Continue to monitor.

## 2023-07-06 NOTE — Assessment & Plan Note (Signed)
Under good control on current regimen. Continue current regimen. Continue to monitor. Call with any concerns. Refills given. Labs drawn today.   

## 2023-07-06 NOTE — Telephone Encounter (Unsigned)
Copied from CRM [id number]. Topic: General - Other >> Jul 06, 2023  2:25 PM Everette C wrote: Reason for CRM: The patient has called to check on the status of their  traMADol (ULTRAM-ER) 100 MG 24 hr tablet [[id number] prior authorization   Please contact further when available

## 2023-07-06 NOTE — Assessment & Plan Note (Signed)
Due to see pm&r next week. Pain improved with lyrica, but not good. Continue lyrica and muscle relaxers PRN. Will give tramadol for now- discussed that we do not do chronic pain medicine in this office, but we are happy to bridge her for now. Follow up 1 month. Call with any concerns.

## 2023-07-07 NOTE — Telephone Encounter (Signed)
Working on [employer]. Please see pt chart

## 2023-07-08 ENCOUNTER — Ambulatory Visit
Admission: RE | Admit: 2023-07-08 | Discharge: 2023-07-08 | Disposition: A | Discharge status: Home / Self Care | Payer: Medicare Other | Source: Ambulatory Visit | Attending: Orthopedic Surgery | Admitting: Orthopedic Surgery

## 2023-07-08 ENCOUNTER — Ambulatory Visit: Admission: RE | Admit: 2023-07-08 | Payer: Medicare Other | Source: Home / Self Care

## 2023-07-08 ENCOUNTER — Other Ambulatory Visit: Payer: Self-pay | Admitting: Family Medicine

## 2023-07-08 DIAGNOSIS — Z981 Arthrodesis status: Secondary | ICD-10-CM | POA: Insufficient documentation

## 2023-07-08 DIAGNOSIS — M47812 Spondylosis without myelopathy or radiculopathy, cervical region: Secondary | ICD-10-CM | POA: Insufficient documentation

## 2023-07-08 NOTE — Telephone Encounter (Signed)
Refilled 07/04/23 # 180. Requested Prescriptions  Refused Prescriptions Disp Refills   buPROPion (WELLBUTRIN SR) 150 MG 12 hr tablet [Pharmacy Med Name: BUPROPION SR 150MG  TABLETS (12 H)] 180 tablet 0    Sig: TAKE 1 TABLET(150 MG) BY MOUTH TWICE DAILY     Psychiatry: Antidepressants - bupropion Passed - 07/08/2023  6:22 AM      Passed - Cr in normal range and within 360 days    Creatinine, Ser  Date Value Ref Range Status  07/04/2023 0.73 0.57 - 1.00 mg/dL Final         Passed - AST in normal range and within 360 days    AST  Date Value Ref Range Status  05/31/2023 20 0 - 40 IU/L Final         Passed - ALT in normal range and within 360 days    ALT  Date Value Ref Range Status  05/31/2023 23 0 - 32 IU/L Final         Passed - Completed PHQ-2 or PHQ-9 in the last 360 days      Passed - Last BP in normal range    BP Readings from Last 1 Encounters:  07/04/23 133/80         Passed - Valid encounter within last 6 months    Recent Outpatient Visits           4 days ago Osteoarthritis of spine with radiculopathy, cervical region   [facility] [place], Megan P, DO   3 weeks ago Osteoarthritis of spine with radiculopathy, cervical region   [place] [facility] [place], Erin E, PA-C   1 month ago Osteoarthritis of spine with radiculopathy, cervical region   [facility] [place], [place], DO   2 months ago Insomnia, unspecified type   [place] [facility] [place], Megan P, DO   2 months ago PTSD (post-traumatic stress disorder)   [place] [facility] Dorcas Carrow, DO       Future Appointments             In 1 week Dorcas Carrow, DO [place] [facility]   In 3 weeks Celso Amy, PA-C [facility] [place] [facility]

## 2023-07-08 NOTE — Telephone Encounter (Signed)
See other phone encounter.  

## 2023-07-08 NOTE — Telephone Encounter (Signed)
Pt called to check status of Prior [employer]. Please advise

## 2023-07-11 ENCOUNTER — Ambulatory Visit: Payer: Self-pay | Admitting: *Deleted

## 2023-07-11 ENCOUNTER — Other Ambulatory Visit: Payer: Self-pay | Admitting: Family Medicine

## 2023-07-11 MED ORDER — TRAMADOL HCL 100 MG PO TABS: 100.0000 mg | ORAL_TABLET | Freq: Three times a day (TID) | ORAL | 0 refills | Status: DC | PRN

## 2023-07-11 NOTE — Telephone Encounter (Signed)
Opened chart to answer a question for the agent.   [facility] calling in for clarification on Tramadol order.   Since it is a controlled substance I can not advise on this so asked her to transfer the call into the practice for Dr. Olevia Perches to address.

## 2023-07-15 ENCOUNTER — Other Ambulatory Visit: Payer: Self-pay | Admitting: Physical Medicine & Rehabilitation

## 2023-07-15 DIAGNOSIS — M5412 Radiculopathy, cervical region: Secondary | ICD-10-CM

## 2023-07-18 ENCOUNTER — Ambulatory Visit: Payer: Medicare Other | Admitting: Family Medicine

## 2023-07-18 ENCOUNTER — Encounter: Payer: Self-pay | Admitting: Gastroenterology

## 2023-07-18 ENCOUNTER — Ambulatory Visit: Payer: Medicare Other | Admitting: Physician Assistant

## 2023-07-18 ENCOUNTER — Encounter: Payer: Self-pay | Admitting: Orthopedic Surgery

## 2023-07-18 NOTE — Telephone Encounter (Signed)
Cervical xrays dated 07/08/23:  FINDINGS: Surgical changes noted at the C4-5 disc space with anterior fusion at C6-7. No evidence for hardware complication. No abnormal motion seen on flexion and extension views. No prevertebral soft tissue swelling.   IMPRESSION: Postsurgical changes without acute bony abnormality.     Electronically Signed   By: Kennith Center M.D.   On: 07/15/2023 07:34   I have personally reviewed the images and agree with the above interpretation.  She saw PMR on 07/13/23 and is scheduled for C7-T1 IL ESI.   Message sent to her regarding xrays.

## 2023-07-19 NOTE — Discharge Instructions (Signed)

## 2023-07-20 ENCOUNTER — Ambulatory Visit
Admission: RE | Admit: 2023-07-20 | Discharge: 2023-07-20 | Disposition: A | Discharge status: Home / Self Care | Payer: Medicare Other | Source: Ambulatory Visit | Attending: Physical Medicine & Rehabilitation | Admitting: Physical Medicine & Rehabilitation

## 2023-07-20 ENCOUNTER — Ambulatory Visit: Payer: Medicare Other | Admitting: Physician Assistant

## 2023-07-20 DIAGNOSIS — M5412 Radiculopathy, cervical region: Secondary | ICD-10-CM

## 2023-07-20 MED ORDER — IOPAMIDOL (ISOVUE-M 300) INJECTION 61%
1.0000 mL | Freq: Once | INTRAMUSCULAR | Status: AC
  Administered 2023-07-20: 1 mL via EPIDURAL

## 2023-07-20 MED ORDER — TRIAMCINOLONE ACETONIDE 40 MG/ML IJ SUSP (RADIOLOGY)
60.0000 mg | Freq: Once | INTRAMUSCULAR | Status: AC
  Administered 2023-07-20: 60 mg via EPIDURAL

## 2023-07-21 ENCOUNTER — Telehealth: Payer: Self-pay

## 2023-07-21 ENCOUNTER — Ambulatory Visit: Payer: Medicare Other | Admitting: Family Medicine

## 2023-07-21 ENCOUNTER — Telehealth: Payer: Self-pay | Admitting: Physician Assistant

## 2023-07-21 MED ORDER — ONDANSETRON 4 MG PO TBDP: 4.0000 mg | ORAL_TABLET | Freq: Three times a day (TID) | ORAL | 0 refills | Status: DC | PRN

## 2023-07-21 NOTE — Telephone Encounter (Signed)
Patient called office today. She is requesting a prescription for Clenpiq  for her Colonoscopy on Monday. She has not been able to tolerate the Golytely in the past.   Clenpiq sample provided w/ instruction sheet. She also would like Zofran for nausea sent in as well. Per Inetta Fermo ok to send Zofran.

## 2023-07-21 NOTE — Telephone Encounter (Signed)
Patient called to see if she can change her prep. Patient also requested some medicine for nausea.

## 2023-07-22 ENCOUNTER — Encounter: Payer: Self-pay | Admitting: Gastroenterology

## 2023-07-25 ENCOUNTER — Telehealth: Payer: Self-pay

## 2023-07-25 ENCOUNTER — Ambulatory Visit: Payer: Medicare Other | Admitting: Anesthesiology

## 2023-07-25 ENCOUNTER — Other Ambulatory Visit: Payer: Self-pay

## 2023-07-25 ENCOUNTER — Ambulatory Visit
Admission: RE | Admit: 2023-07-25 | Discharge: 2023-07-25 | Disposition: A | Discharge status: Home / Self Care | Payer: Medicare Other | Source: Ambulatory Visit | Attending: Gastroenterology | Admitting: Gastroenterology

## 2023-07-25 ENCOUNTER — Encounter
Admission: RE | Disposition: A | Discharge status: Home / Self Care | Payer: Self-pay | Source: Ambulatory Visit | Attending: Gastroenterology

## 2023-07-25 DIAGNOSIS — R1314 Dysphagia, pharyngoesophageal phase: Secondary | ICD-10-CM

## 2023-07-25 DIAGNOSIS — K5909 Other constipation: Secondary | ICD-10-CM

## 2023-07-25 DIAGNOSIS — Z539 Procedure and treatment not carried out, unspecified reason: Secondary | ICD-10-CM | POA: Insufficient documentation

## 2023-07-25 DIAGNOSIS — Z86010 Personal history of colonic polyps: Secondary | ICD-10-CM

## 2023-07-25 DIAGNOSIS — Z8619 Personal history of other infectious and parasitic diseases: Secondary | ICD-10-CM

## 2023-07-25 SURGERY — COLONOSCOPY WITH PROPOFOL
Anesthesia: General

## 2023-07-25 MED ORDER — NA SULFATE-K SULFATE-MG SULF 17.5-3.13-1.6 GM/177ML PO SOLN: 1.0000 | Freq: Once | ORAL | 0 refills | Status: AC

## 2023-07-25 MED ORDER — PEG 3350-KCL-NA BICARB-NACL 420 G PO SOLR: ORAL | 0 refills | Status: DC

## 2023-07-25 MED ORDER — SODIUM CHLORIDE 0.9 % IV SOLN: INTRAVENOUS | Status: DC

## 2023-07-25 MED ORDER — POLYETHYLENE GLYCOL 3350 17 GM/SCOOP PO POWD: 1.0000 | Freq: Once | ORAL | 0 refills | Status: AC

## 2023-07-25 NOTE — Telephone Encounter (Signed)
Patient notified of the below.  SUPREP (PREP SOLUTION DIRECTIONS)   At 5:00 pm the evening before the procedure:  Pour the contents of one bottle of Suprep into the mixing container provided. Fill the container with cold water to the fill line and mix. DRINK all the liquid in the container. You must drink two (2) more 16 ounces containers of water over the next 1 hour.  2.   On the day of the procedure, 5 hours before procedure:  Pour the contents of the second bottle of Suprep into the mixing container provided and follow the same instructions.

## 2023-07-25 NOTE — Telephone Encounter (Signed)
Received message from [place] and L[place] in Endoscopy-pt not cleaned out.  Said Dr. Tobi Bastos advised to reschedule for tomorrow with Dr. Allegra Lai.  New rx for Suprep will need to be sent to pharmacy.  Pt told Lawson Fiscal she cannot tolerate the PEG prep, and Clenpiq did not work.  Prefer Suprep or [employer].  Message forwarded to [facility] she is Tina's patient.  Thanks,  [place], [place]

## 2023-07-26 ENCOUNTER — Encounter
Admission: RE | Disposition: A | Discharge status: Home / Self Care | Payer: Self-pay | Source: Ambulatory Visit | Attending: Gastroenterology

## 2023-07-26 ENCOUNTER — Ambulatory Visit: Payer: Medicare Other | Admitting: Anesthesiology

## 2023-07-26 ENCOUNTER — Ambulatory Visit
Admission: RE | Admit: 2023-07-26 | Discharge: 2023-07-26 | Disposition: A | Discharge status: Home / Self Care | Payer: Medicare Other | Source: Ambulatory Visit | Attending: Gastroenterology | Admitting: Gastroenterology

## 2023-07-26 DIAGNOSIS — Z860100 Personal history of colon polyps, unspecified: Secondary | ICD-10-CM

## 2023-07-26 DIAGNOSIS — K3189 Other diseases of stomach and duodenum: Secondary | ICD-10-CM | POA: Diagnosis not present

## 2023-07-26 DIAGNOSIS — M797 Fibromyalgia: Secondary | ICD-10-CM | POA: Insufficient documentation

## 2023-07-26 DIAGNOSIS — Z1211 Encounter for screening for malignant neoplasm of colon: Secondary | ICD-10-CM

## 2023-07-26 DIAGNOSIS — Z09 Encounter for follow-up examination after completed treatment for conditions other than malignant neoplasm: Secondary | ICD-10-CM | POA: Diagnosis not present

## 2023-07-26 DIAGNOSIS — K449 Diaphragmatic hernia without obstruction or gangrene: Secondary | ICD-10-CM | POA: Insufficient documentation

## 2023-07-26 DIAGNOSIS — K219 Gastro-esophageal reflux disease without esophagitis: Secondary | ICD-10-CM | POA: Diagnosis not present

## 2023-07-26 DIAGNOSIS — R1310 Dysphagia, unspecified: Secondary | ICD-10-CM | POA: Diagnosis not present

## 2023-07-26 DIAGNOSIS — K5909 Other constipation: Secondary | ICD-10-CM

## 2023-07-26 DIAGNOSIS — G4730 Sleep apnea, unspecified: Secondary | ICD-10-CM | POA: Diagnosis not present

## 2023-07-26 DIAGNOSIS — R195 Other fecal abnormalities: Secondary | ICD-10-CM

## 2023-07-26 DIAGNOSIS — R1319 Other dysphagia: Secondary | ICD-10-CM

## 2023-07-26 DIAGNOSIS — F32A Depression, unspecified: Secondary | ICD-10-CM | POA: Diagnosis not present

## 2023-07-26 HISTORY — PX: ESOPHAGOGASTRODUODENOSCOPY: SHX5428

## 2023-07-26 HISTORY — PX: COLONOSCOPY WITH PROPOFOL: SHX5780

## 2023-07-26 HISTORY — PX: BIOPSY: SHX5522

## 2023-07-26 SURGERY — COLONOSCOPY WITH PROPOFOL
Anesthesia: General

## 2023-07-26 MED ORDER — PROPOFOL 10 MG/ML IV BOLUS
INTRAVENOUS | Status: DC | PRN
  Administered 2023-07-26: 100 ug/kg/min via INTRAVENOUS
  Administered 2023-07-26 (×4): 50 mg via INTRAVENOUS

## 2023-07-26 MED ORDER — DEXMEDETOMIDINE HCL IN NACL 80 MCG/20ML IV SOLN
INTRAVENOUS | Status: DC | PRN
  Administered 2023-07-26: 4 ug via INTRAVENOUS
  Administered 2023-07-26: 8 ug via INTRAVENOUS

## 2023-07-26 MED ORDER — ONDANSETRON HCL 4 MG/2ML IJ SOLN
INTRAMUSCULAR | Status: DC | PRN
  Administered 2023-07-26: 4 mg via INTRAVENOUS

## 2023-07-26 MED ORDER — SODIUM CHLORIDE 0.9 % IV SOLN: INTRAVENOUS | Status: DC

## 2023-07-26 MED ORDER — OMEPRAZOLE 20 MG PO CPDR: 40.0000 mg | DELAYED_RELEASE_CAPSULE | Freq: Every day | ORAL | 1 refills | Status: DC

## 2023-07-26 MED ORDER — LIDOCAINE HCL (CARDIAC) PF 100 MG/5ML IV SOSY
PREFILLED_SYRINGE | INTRAVENOUS | Status: DC | PRN
  Administered 2023-07-26: 100 mg via INTRAVENOUS

## 2023-07-26 MED ORDER — PROPOFOL 10 MG/ML IV BOLUS
INTRAVENOUS | Status: AC
  Filled 2023-07-26: qty 40

## 2023-07-26 MED ORDER — ONDANSETRON HCL 4 MG/2ML IJ SOLN
INTRAMUSCULAR | Status: AC
  Filled 2023-07-26: qty 2

## 2023-07-26 NOTE — Anesthesia Preprocedure Evaluation (Addendum)
Anesthesia Evaluation  Patient identified by MRN, date of birth, ID band Patient awake    Reviewed: Allergy & Precautions, NPO status , Patient's Chart, lab work & pertinent test results  History of Anesthesia Complications Negative for: history of anesthetic complications  Airway Mallampati: III  TM Distance: <3 FB Neck ROM: full    Dental  (+) Chipped   Pulmonary asthma , sleep apnea    Pulmonary exam normal        Cardiovascular Exercise Tolerance: Good hypertension, (-) angina Normal cardiovascular exam     Neuro/Psych  Headaches PSYCHIATRIC DISORDERS       Neuromuscular disease    GI/Hepatic Neg liver ROS,GERD  Controlled,,  Endo/Other  negative endocrine ROS    Renal/GU negative Renal ROS  negative genitourinary   Musculoskeletal   Abdominal   Peds  Hematology negative hematology ROS (+)   Anesthesia Other Findings Patient reports that they do not think that any food or pills are stuck in their throat at this time.  Past Medical History: No date: Abnormal vaginal Pap smear No date: Depression No date: Fibromyalgia No date: hx of Conversion disorder No date: hx of Peritonitis (HCC) No date: Mitral valve regurgitation No date: Sleep apnea No date: Thyroid nodule  Past Surgical History: No date: ABDOMINAL HYSTERECTOMY No date: ANTERIOR / POSTERIOR COMBINED FUSION CERVICAL SPINE No date: ANTERIOR LAT LUMBAR FUSION No date: BREAST BIOPSY; N/A     Comment:  pt unsure which breast 10 years ago-fibrous tissue No date: CESAREAN SECTION No date: INGUINAL HERNIA REPAIR No date: NOSE SURGERY No date: TUMOR REMOVAL     Comment:  on her SI joint  BMI    Body Mass Index: 32.66 kg/m      Reproductive/Obstetrics negative OB ROS                             Anesthesia Physical Anesthesia Plan  ASA: 3  Anesthesia Plan: General   Post-op Pain Management:    Induction:  Intravenous  PONV Risk Score and Plan: Propofol infusion and TIVA  Airway Management Planned: Natural Airway and Nasal Cannula  Additional Equipment:   Intra-op Plan:   Post-operative Plan:   Informed Consent: I have reviewed the patients History and Physical, chart, labs and discussed the procedure including the risks, benefits and alternatives for the proposed anesthesia with the patient or authorized representative who has indicated his/her understanding and acceptance.     Dental Advisory Given  Plan Discussed with: Anesthesiologist, CRNA and Surgeon  Anesthesia Plan Comments: (Patient consented for risks of anesthesia including but not limited to:  - adverse reactions to medications - risk of airway placement if required - damage to eyes, teeth, lips or other oral mucosa - nerve damage due to positioning  - sore throat or hoarseness - Damage to heart, brain, nerves, lungs, other parts of body or loss of life  Patient voiced understanding.)       Anesthesia Quick Evaluation

## 2023-07-26 NOTE — Op Note (Signed)
[facility] Gastroenterology Patient Name: Kaitlyn Frazier Procedure Date: 07/26/2023 11:50 AM MRN: [id number] Account #: [id number] Date of Birth: 29-Aug-1961 Admit Type: Outpatient Age: 62 Room: [facility] ENDO ROOM 3 Gender: Female Note Status: Finalized Instrument Name: Prentice Docker [id number] Procedure:             Colonoscopy Indications:           High risk colon cancer surveillance: Personal history                         of colonic polyps Providers:             Toney Reil MD, MD Medicines:             General Anesthesia Complications:         No immediate complications. Estimated blood loss: None. Procedure:             Pre-Anesthesia Assessment:                        - Prior to the procedure, a History and Physical was                         performed, and patient medications and allergies were                         reviewed. The patient is competent. The risks and                         benefits of the procedure and the sedation options and                         risks were discussed with the patient. All questions                         were answered and informed consent was obtained.                         Patient identification and proposed procedure were                         verified by the physician, the nurse, the                         anesthesiologist, the anesthetist and the technician                         in the pre-procedure area in the procedure room in the                         endoscopy suite. Mental Status Examination: alert and                         oriented. Airway Examination: normal oropharyngeal                         airway and neck mobility. Respiratory Examination:                         clear to auscultation. CV  Examination: normal.                         Prophylactic Antibiotics: The patient does not require                         prophylactic antibiotics. Prior Anticoagulants: The                          patient has taken no anticoagulant or antiplatelet                         agents. ASA Grade Assessment: III - A patient with                         severe systemic disease. After reviewing the risks and                         benefits, the patient was deemed in satisfactory                         condition to undergo the procedure. The anesthesia                         plan was to use general anesthesia. Immediately prior                         to administration of medications, the patient was                         re-assessed for adequacy to receive sedatives. The                         heart rate, respiratory rate, oxygen saturations,                         blood pressure, adequacy of pulmonary ventilation, and                         response to care were monitored throughout the                         procedure. The physical status of the patient was                         re-assessed after the procedure.                        After obtaining informed consent, the colonoscope was                         passed under direct vision. Throughout the procedure,                         the patient's blood pressure, pulse, and oxygen                         saturations were monitored continuously. The  Colonoscope was introduced through the anus and                         advanced to the the cecum, identified by appendiceal                         orifice and ileocecal valve. The colonoscopy was                         performed with moderate difficulty due to significant                         looping and the patient's body habitus. Successful                         completion of the procedure was aided by applying                         abdominal pressure. The patient tolerated the                         procedure well. The quality of the bowel preparation                         was evaluated using the BBPS [facility] Bowel Preparation                          Scale) with scores of: Right Colon = 3, Transverse                         Colon = 3 and Left Colon = 3 (entire mucosa seen well                         with no residual staining, small fragments of stool or                         opaque liquid). The total BBPS score equals 9. The                         ileocecal valve, appendiceal orifice, and rectum were                         photographed. Findings:      The perianal and digital rectal examinations were normal. Pertinent       negatives include normal sphincter tone and no palpable rectal lesions.      The entire examined colon appeared normal. Biopsies for histology were       taken with a cold forceps from the entire colon for evaluation of       microscopic colitis.      The retroflexed view of the distal rectum and anal verge was normal and       showed no anal or rectal abnormalities. Impression:            - The entire examined colon is normal. Biopsied.                        - The distal rectum and  anal verge are normal on                         retroflexion view. Recommendation:        - Discharge patient to home (with escort).                        - Resume previous diet today.                        - Continue present medications.                        - Await pathology results.                        - Repeat colonoscopy in 10 years for screening                         purposes. Procedure Code(s):     --- Professional ---                        [phone], Colonoscopy, flexible; with biopsy, single or                         multiple Diagnosis Code(s):     --- Professional ---                        Z86.010, Personal history of colonic polyps CPT copyright 2022 American Medical Association. All rights reserved. The codes documented in this report are preliminary and upon coder review may  be revised to meet current compliance requirements. Dr. Libby Maw Toney Reil MD, MD 07/26/2023 12:32:24 PM This  report has been signed electronically. Number of Addenda: 0 Note Initiated On: 07/26/2023 11:50 AM Scope Withdrawal Time: 0 hours 10 minutes 23 seconds  Total Procedure Duration: 0 hours 20 minutes 37 seconds  Estimated Blood Loss:  Estimated blood loss: none.      [facility]

## 2023-07-26 NOTE — Op Note (Signed)
[facility] Gastroenterology Patient Name: Kaitlyn Frazier Procedure Date: 07/26/2023 11:40 AM MRN: [id number] Account #: [id number] Date of Birth: 1961-07-22 Admit Type: Outpatient Age: 62 Room: 3 Gender: Female Note Status: Finalized Instrument Name: Upper Endoscope [id number] Procedure:             Upper GI endoscopy Indications:           Dysphagia Providers:             Toney Reil MD, MD Referring MD:          Toney Reil MD, MD (Referring MD) Medicines:             General Anesthesia Complications:         No immediate complications. Estimated blood loss: None. Procedure:             Pre-Anesthesia Assessment:                        - Prior to the procedure, a History and Physical was                         performed, and patient medications and allergies were                         reviewed. The patient is competent. The risks and                         benefits of the procedure and the sedation options and                         risks were discussed with the patient. All questions                         were answered and informed consent was obtained.                         Patient identification and proposed procedure were                         verified by the physician, the nurse, the                         anesthesiologist, the anesthetist and the technician                         in the pre-procedure area in the procedure room in the                         endoscopy suite. Mental Status Examination: alert and                         oriented. Airway Examination: normal oropharyngeal                         airway and neck mobility. Respiratory Examination:                         clear to auscultation. CV Examination: normal.  Prophylactic Antibiotics: The patient does not require                         prophylactic antibiotics. Prior Anticoagulants: The                         patient has taken no  anticoagulant or antiplatelet                         agents. ASA Grade Assessment: III - A patient with                         severe systemic disease. After reviewing the risks and                         benefits, the patient was deemed in satisfactory                         condition to undergo the procedure. The anesthesia                         plan was to use general anesthesia. Immediately prior                         to administration of medications, the patient was                         re-assessed for adequacy to receive sedatives. The                         heart rate, respiratory rate, oxygen saturations,                         blood pressure, adequacy of pulmonary ventilation, and                         response to care were monitored throughout the                         procedure. The physical status of the patient was                         re-assessed after the procedure.                        After obtaining informed consent, the endoscope was                         passed under direct vision. Throughout the procedure,                         the patient's blood pressure, pulse, and oxygen                         saturations were monitored continuously. The Endoscope                         was introduced through the mouth, and advanced to the  second part of duodenum. The upper GI endoscopy was                         accomplished without difficulty. The patient tolerated                         the procedure well. Findings:      The duodenal bulb and second portion of the duodenum were normal.       Biopsies for histology were taken with a cold forceps for evaluation of       celiac disease.      A small hiatal hernia was present.      The entire examined stomach was normal.      Striped mildly erythematous mucosa without bleeding was found in the       gastric fundus.      Esophagogastric landmarks were identified: the  gastroesophageal junction       was found at 35 cm from the incisors.      The gastroesophageal junction and examined esophagus were normal.       Biopsies were taken with a cold forceps for histology. Impression:            - Normal duodenal bulb and second portion of the                         duodenum. Biopsied.                        - Small hiatal hernia.                        - Normal stomach.                        - Erythematous mucosa in the gastric fundus.                        - Esophagogastric landmarks identified.                        - Normal gastroesophageal junction and esophagus.                         Biopsied. Recommendation:        - Await pathology results.                        - Follow an antireflux regimen indefinitely.                        - Continue present medications.                        - Use a proton pump inhibitor PO BID. Procedure Code(s):     --- Professional ---                        [phone], Esophagogastroduodenoscopy, flexible,                         transoral; with biopsy, single or multiple Diagnosis Code(s):     --- Professional ---  K44.9, Diaphragmatic hernia without obstruction or                         gangrene                        K31.89, Other diseases of stomach and duodenum                        R13.10, Dysphagia, unspecified CPT copyright 2022 American Medical Association. All rights reserved. The codes documented in this report are preliminary and upon coder review may  be revised to meet current compliance requirements. Dr. Libby Maw Toney Reil MD, MD 07/26/2023 12:08:01 PM This report has been signed electronically. Number of Addenda: 0 Note Initiated On: 07/26/2023 11:40 AM Estimated Blood Loss:  Estimated blood loss: none.      [facility]

## 2023-07-26 NOTE — Transfer of Care (Signed)
Immediate Anesthesia Transfer of Care Note  Patient: Kaitlyn Frazier  Procedure(s) Performed: COLONOSCOPY WITH PROPOFOL ESOPHAGOGASTRODUODENOSCOPY (EGD) BIOPSY  Patient Location: PACU  Anesthesia Type:MAC  Level of Consciousness: awake  Airway & Oxygen Therapy: Patient Spontanous Breathing  Post-op Assessment: Report given to RN and Post -op Vital signs reviewed and stable  Post vital signs: Reviewed and stable  Last Vitals:  Vitals Value Taken Time  BP 119/51 07/26/23 1236  Temp 35.7 C 07/26/23 1236  Pulse 81 07/26/23 1236  Resp 17 07/26/23 1236  SpO2 97 % 07/26/23 1236    Last Pain:  Vitals:   07/26/23 1236  TempSrc: Temporal  PainSc: 0-No pain         Complications: No notable events documented.

## 2023-07-26 NOTE — H&P (Signed)
Arlyss Repress, MD [address]  [address]  [place], [place] [address]  Main: [phone]  Fax: [phone] Pager: [phone]  Primary Care Physician:  Dorcas Carrow, DO Primary Gastroenterologist:  Dr. Arlyss Repress  Pre-Procedure History & Physical: HPI:  Kaitlyn Frazier is a 62 y.o. female is here for an EGD and colonoscopy.   Past Medical History:  Diagnosis Date   Abnormal vaginal Pap smear    Depression    Fibromyalgia    hx of Conversion disorder    hx of Peritonitis (HCC)    Mitral valve regurgitation    Sleep apnea    Thyroid nodule     Past Surgical History:  Procedure Laterality Date   ABDOMINAL HYSTERECTOMY     ANTERIOR / POSTERIOR COMBINED FUSION CERVICAL SPINE     ANTERIOR LAT LUMBAR FUSION     BREAST BIOPSY N/A    pt unsure which breast 10 years ago-fibrous tissue   CESAREAN SECTION     INGUINAL HERNIA REPAIR     NOSE SURGERY     TUMOR REMOVAL     on her SI joint    Prior to Admission medications   Medication Sig Start Date End Date Taking? Authorizing Provider  albuterol (VENTOLIN HFA) 108 (90 Base) MCG/ACT inhaler Inhale 1-2 puffs into the lungs every 6 (six) hours as needed. 02/08/23   Mickie Bail, NP  buPROPion [facility] SR) 150 MG 12 hr tablet Take 1 tablet (150 mg total) by mouth 2 (two) times daily. 07/04/23   Johnson, Megan P, DO  carisoprodol (SOMA) 250 MG tablet Take 1 tablet (250 mg total) by mouth 4 (four) times daily as needed. Patient not taking: Reported on 07/18/2023 07/01/23   Olevia Perches P, DO  Cholecalciferol (VITAMIN D-3) 25 MCG (1000 UT) CAPS Take by mouth.    [provider]  DULoxetine (CYMBALTA) 60 MG capsule Take 1 capsule (60 mg total) by mouth daily. 07/04/23   Johnson, Megan P, DO  hydrochlorothiazide (HYDRODIURIL) 25 MG tablet Take 1 tablet (25 mg total) by mouth daily. 07/04/23   Johnson, Megan P, DO  lidocaine (LIDODERM) 5 % Place 1 patch onto the skin every 12 (twelve) hours. Remove after 12  hours and then you can put on a new patch 12 hours later (12 hours on and then 12 hours off). 06/21/23 06/20/24  Drake Leach, PA-C  meloxicam (MOBIC) 15 MG tablet Take 1 tablet (15 mg total) by mouth daily. 07/04/23   Johnson, Megan P, DO  metaxalone (SKELAXIN) 800 MG tablet Take 800 mg by mouth 3 (three) times daily.    [provider]  metroNIDAZOLE (METROGEL) 1 % gel Apply topically daily. 06/16/23   Mecum, Erin E, PA-C  naloxone (NARCAN) nasal spray 4 mg/0.1 mL Spray into nose if found unresponsive after taking opioid medications. 06/16/23   Mecum, Erin E, PA-C  omeprazole (PRILOSEC) 20 MG capsule Take 1 capsule (20 mg total) by mouth daily. 04/13/23   Johnson, Megan P, DO  ondansetron (ZOFRAN-ODT) 4 MG disintegrating tablet Take 1 tablet (4 mg total) by mouth every 8 (eight) hours as needed for nausea or vomiting. 07/21/23   Celso Amy, PA-C  polyethylene glycol-electrolytes (NULYTELY) 420 g solution Prepare according to package instructions. Starting at 5:00 PM: Drink one 8 oz glass of mixture every 15 minutes until you finish half of the jug. Five hours prior to procedure, drink 8 oz glass of mixture every 15 minutes until it is all gone.  Make sure you do not drink anything 4 hours prior to your procedure. 07/25/23   Wyline Mood, MD  pregabalin (LYRICA) 150 MG capsule Take 1 capsule (150 mg total) by mouth 3 (three) times daily. 06/22/23   Johnson, Megan P, DO  traMADol HCl 100 MG TABS Take 100 mg by mouth 3 (three) times daily as needed. 07/11/23   Johnson, Megan P, DO  zolpidem (AMBIEN) 5 MG tablet TAKE 1 TABLET(5 MG) BY MOUTH AT BEDTIME AS NEEDED FOR SLEEP 06/22/23   Aura Dials T, NP    Allergies as of 07/25/2023 - Review Complete 07/25/2023  Allergen Reaction Noted   Gabapentin Other (See Comments), Hives, and Rash 04/16/2022   Amitriptyline Other (See Comments) 04/16/2022   Baclofen Other (See Comments) 04/16/2022   Cyclobenzaprine Other (See Comments) 04/16/2022   Doxepin   11/24/2022   Latex Hives 04/16/2022   Norflex tablets [orphenadrine]  05/24/2022   Pollen extract Itching 04/16/2022   Tizanidine Other (See Comments) 08/04/2022   Other Rash 04/16/2022    Family History  Problem Relation Age of Onset   Alcohol abuse Mother    Bipolar disorder Mother    Uterine cancer Paternal Aunt        17s   Ovarian cancer Paternal Aunt        74s   Ovarian cancer Paternal Aunt        25s   Ovarian cancer Cousin        56s   Tourette syndrome Son     Social History   Socioeconomic History   Marital status: Married    Spouse name: Justin Mend   Number of children: 1   Years of education: Not on file   Highest education level: Master's degree (e.g., MA, MS, MEng, MEd, MSW, MBA)  Occupational History   Not on file  Tobacco Use   Smoking status: Never    Passive exposure: Never   Smokeless tobacco: Never   Tobacco comments:    Sometimes vaping  Vaping Use   Vaping status: Some Days   Substances: Nicotine  Substance and Sexual Activity   Alcohol use: Never   Drug use: Never   Sexual activity: Yes  Other Topics Concern   Not on file  Social History Narrative   Not on file   Social Determinants of Health   Financial Resource Strain: Low Risk  (04/11/2023)   Overall Financial Resource Strain (CARDIA)    Difficulty of Paying Living Expenses: Not hard at all  Food Insecurity: No Food Insecurity (04/11/2023)   Hunger Vital Sign    Worried About Running Out of Food in the Last Year: Never true    Ran Out of Food in the Last Year: Never true  Transportation Needs: No Transportation Needs (04/11/2023)   PRAPARE - Administrator, Civil Service (Medical): No    Lack of Transportation (Non-Medical): No  Physical Activity: Insufficiently Active (04/11/2023)   Exercise Vital Sign    Days of Exercise per Week: 3 days    Minutes of Exercise per Session: 30 min  Stress: No Stress Concern Present (04/11/2023)   [employer] of Occupational Health -  Occupational Stress Questionnaire    Feeling of Stress : Only a little  Social Connections: Moderately Isolated (04/11/2023)   Social Connection and Isolation Panel [NHANES]    Frequency of Communication with Friends and Family: Once a week    Frequency of Social Gatherings with Friends and Family: Never    Attends Religious  Services: Never    Active Member of Clubs or Organizations: Yes    Attends Engineer, structural: More than 4 times per year    Marital Status: Married  Catering manager Violence: Not At Risk (04/11/2023)   Humiliation, Afraid, Rape, and Kick questionnaire    Fear of Current or Ex-Partner: No    Emotionally Abused: No    Physically Abused: No    Sexually Abused: No    Review of Systems: See HPI, otherwise negative ROS  Physical Exam: BP (!) 149/76   Pulse 97   Temp (!) 97.5 F (36.4 C) (Temporal)   Resp 16   Ht 5\' 3"  (1.6 m)   Wt 83.6 kg   SpO2 98%   BMI 32.66 kg/m  General:   Alert,  pleasant and cooperative in NAD Head:  Normocephalic and atraumatic. Neck:  Supple; no masses or thyromegaly. Lungs:  Clear throughout to auscultation.    Heart:  Regular rate and rhythm. Abdomen:  Soft, nontender and nondistended. Normal bowel sounds, without guarding, and without rebound.   Neurologic:  Alert and  oriented x4;  grossly normal neurologically.  Impression/Plan: Kaitlyn Frazier is here for a colonoscopy to be performed for h/o colon polyps, dysphagia  Risks, benefits, limitations, and alternatives regarding  endoscopy and colonoscopy have been reviewed with the patient.  Questions have been answered.  All parties agreeable.   Lannette Donath, MD  07/26/2023, 11:16 AM

## 2023-07-27 ENCOUNTER — Encounter: Payer: Self-pay | Admitting: Gastroenterology

## 2023-07-27 NOTE — Anesthesia Postprocedure Evaluation (Signed)
Anesthesia Post Note  Patient: MARIALIZ GONZELEZ  Procedure(s) Performed: COLONOSCOPY WITH PROPOFOL ESOPHAGOGASTRODUODENOSCOPY (EGD) BIOPSY  Patient location during evaluation: Endoscopy Anesthesia Type: General Level of consciousness: awake and alert Pain management: pain level controlled Vital Signs Assessment: post-procedure vital signs reviewed and stable Respiratory status: spontaneous breathing, nonlabored ventilation, respiratory function stable and patient connected to nasal cannula oxygen Cardiovascular status: blood pressure returned to baseline and stable Postop Assessment: no apparent nausea or vomiting Anesthetic complications: no   No notable events documented.   Last Vitals:  Vitals:   07/26/23 1236 07/26/23 1246  BP: (!) 119/51 118/70  Pulse: 81 79  Resp: 17 15  Temp: (!) 35.7 C   SpO2: 97% 98%    Last Pain:  Vitals:   07/26/23 1246  TempSrc:   PainSc: 0-No pain                 Cleda Mccreedy Ryman Rathgeber

## 2023-08-01 NOTE — Progress Notes (Unsigned)
Referring Physician:  Dorcas Carrow, DO [address] [place],  [place] [address]  Primary Physician:  Dorcas Carrow, DO  History of Present Illness: 08/01/2023 [name] has a history of HTN, asthma, OSA, hyperlipidemia, and chronic pain.   She has a history of cervical disc arthoplasty C4-C5 and ACDF C6-C7 in 2011. She did well until fall in August of 2023.   Last seen by me on 06/21/23 for neck pain and headaches that started after dislocating her jaw with fall in August of 2023.   She has a residual bone spur on left at C6-C7 resulting in mild spinal stenosis and moderate left C7 foraminal stenosis. Also with right spur/disc at C5-C6 with moderate central stenosis and mild right C6 foraminal stenosis.    Dr. Myer Haff thinks most of her pain is from C6-C7. He does not think her headaches are related to her neck pain.   She was referred to PMR for injections and is here for follow up. Referral to neurology recommended for headaches and she declined.   She is scheduled for C7-T1 IL ESI with Dr. Mariah Milling on 08/10/23.         She dislocated her jaw with fall in August of 2023. Since that time, she has constant neck pain that radiates into her shoulder blades, left > right. No arm pain. She notes headaches associated with her neck pain- they have gotten worse in last week.   At C6-C7 she has residual bone spur on left resulting in mild spinal stenosis and moderate left C7 foraminal stenosis. Also with right spur/disc at C5-C6 with moderate central stenosis and mild right C6 foraminal stenosis.    Dr. Myer Haff thinks most of her pain is from C6-C7. He does not think her headaches are related to her neck pain.                  She is seeing [facility]. Saw PCP on 06/16/23 for pain- was given prednisone and ultram.   History of lumbar fusion L5-S1. History of cervical disc arthoplasty C4-C5 and ACDF C6-C7 in 2011.   Pain improved after her surgery. She  had a fall in August of 2023 (dislocated her jaw) and since that time she has constant neck pain that radiates into her shoulder blades, left > right. No arm pain. She notes headaches associated with her neck pain- they have gotten worse in last week. She does have weakness in both hands. Pain is worse with reaching over head, working on computer. No alleviating factors. She has some issues with hand dexterity. She has some dizziness when pain is severe.   Was recently given appliance for her jaw to pull the jaw forward- this seems to have made her pain worse.   She is in PT at [facility]- has been going for months with minimal relief.   Stopped voltaren due to GI upset. No relief with prednisone or ultram.   Bowel/Bladder Dysfunction: none  She vapes with nicotine 2-3 times per day.   Conservative measures:  Physical therapy: had osteopathic manipulative medicine done by PCP, she is doing PT at [facility] without relief.  Multimodal medical therapy including regular antiinflammatories: ultram, lyrcia, cymbalta, diclofenac, prednisone Injections:  denies epidural steroid injections  Past Surgery:  History of lumbar fusion L5-S1 2010 History of cervical disc arthoplasty C4-C5 and ACDF C6-C7.  2011  Modena Morrow Taber has no symptoms of cervical myelopathy.  The symptoms are causing a significant impact  on the patient's life.   Review of Systems:  A 10 point review of systems is negative, except for the pertinent positives and negatives detailed in the HPI.  Past Medical History: Past Medical History:  Diagnosis Date   Abnormal vaginal Pap smear    Depression    Fibromyalgia    hx of Conversion disorder    hx of Peritonitis (HCC)    Mitral valve regurgitation    Sleep apnea    Thyroid nodule     Past Surgical History: Past Surgical History:  Procedure Laterality Date   ABDOMINAL HYSTERECTOMY     ANTERIOR / POSTERIOR COMBINED FUSION CERVICAL SPINE     ANTERIOR LAT LUMBAR FUSION      BIOPSY  07/26/2023   Procedure: BIOPSY;  Surgeon: Toney Reil, MD;  Location: [facility] ENDOSCOPY;  Service: Gastroenterology;;   BREAST BIOPSY N/A    pt unsure which breast 10 years ago-fibrous tissue   CESAREAN SECTION     COLONOSCOPY WITH PROPOFOL N/A 07/26/2023   Procedure: COLONOSCOPY WITH PROPOFOL;  Surgeon: Toney Reil, MD;  Location: [facility] ENDOSCOPY;  Service: Gastroenterology;  Laterality: N/A;   ESOPHAGOGASTRODUODENOSCOPY  07/26/2023   Procedure: ESOPHAGOGASTRODUODENOSCOPY (EGD);  Surgeon: Toney Reil, MD;  Location: [facility] ENDOSCOPY;  Service: Gastroenterology;;   INGUINAL HERNIA REPAIR     NOSE SURGERY     TUMOR REMOVAL     on her SI joint    Allergies: Allergies as of 08/02/2023 - Review Complete 07/26/2023  Allergen Reaction Noted   Gabapentin Other (See Comments), Hives, and Rash 04/16/2022   Amitriptyline Other (See Comments) 04/16/2022   Baclofen Other (See Comments) 04/16/2022   Cyclobenzaprine Other (See Comments) 04/16/2022   Doxepin  11/24/2022   Latex Hives 04/16/2022   Norflex tablets [orphenadrine]  05/24/2022   Pollen extract Itching 04/16/2022   Tizanidine Other (See Comments) 08/04/2022   Other Rash 04/16/2022    Medications: Outpatient Encounter Medications as of 08/02/2023  Medication Sig   albuterol (VENTOLIN HFA) 108 (90 Base) MCG/ACT inhaler Inhale 1-2 puffs into the lungs every 6 (six) hours as needed.   buPROPion (WELLBUTRIN SR) 150 MG 12 hr tablet Take 1 tablet (150 mg total) by mouth 2 (two) times daily.   carisoprodol (SOMA) 250 MG tablet Take 1 tablet (250 mg total) by mouth 4 (four) times daily as needed. (Patient not taking: Reported on 07/18/2023)   Cholecalciferol (VITAMIN D-3) 25 MCG (1000 UT) CAPS Take by mouth.   DULoxetine (CYMBALTA) 60 MG capsule Take 1 capsule (60 mg total) by mouth daily.   hydrochlorothiazide (HYDRODIURIL) 25 MG tablet Take 1 tablet (25 mg total) by mouth daily.   lidocaine (LIDODERM) 5 % Place 1  patch onto the skin every 12 (twelve) hours. Remove after 12 hours and then you can put on a new patch 12 hours later (12 hours on and then 12 hours off).   meloxicam (MOBIC) 15 MG tablet Take 1 tablet (15 mg total) by mouth daily.   metaxalone (SKELAXIN) 800 MG tablet Take 800 mg by mouth 3 (three) times daily.   metroNIDAZOLE (METROGEL) 1 % gel Apply topically daily.   naloxone (NARCAN) nasal spray 4 mg/0.1 mL Spray into nose if found unresponsive after taking opioid medications.   omeprazole (PRILOSEC) 20 MG capsule Take 2 capsules (40 mg total) by mouth daily before breakfast.   ondansetron (ZOFRAN-ODT) 4 MG disintegrating tablet Take 1 tablet (4 mg total) by mouth every 8 (eight) hours as needed for nausea or  vomiting.   pregabalin (LYRICA) 150 MG capsule Take 1 capsule (150 mg total) by mouth 3 (three) times daily.   traMADol HCl 100 MG TABS Take 100 mg by mouth 3 (three) times daily as needed.   zolpidem (AMBIEN) 5 MG tablet TAKE 1 TABLET(5 MG) BY MOUTH AT BEDTIME AS NEEDED FOR SLEEP   No facility-administered encounter medications on file as of 08/02/2023.    Social History: Social History   Tobacco Use   Smoking status: Never    Passive exposure: Never   Smokeless tobacco: Never   Tobacco comments:    Sometimes vaping  Vaping Use   Vaping status: Some Days   Substances: Nicotine  Substance Use Topics   Alcohol use: Never   Drug use: Never    Family Medical History: Family History  Problem Relation Age of Onset   Alcohol abuse Mother    Bipolar disorder Mother    Uterine cancer Paternal Aunt        25s   Ovarian cancer Paternal Aunt        17s   Ovarian cancer Paternal Aunt        66s   Ovarian cancer Cousin        40s   Tourette syndrome Son     Physical Examination: There were no vitals filed for this visit.   General: Patient is well developed, well nourished, calm, collected, and in no apparent distress. Attention to examination is  appropriate.  Respiratory: Patient is breathing without any difficulty.   NEUROLOGICAL:     Awake, alert, oriented to person, place, and time.  Speech is clear and fluent. Fund of knowledge is appropriate.   Cranial Nerves: Pupils equal round and reactive to light.  Facial tone is symmetric.    She has lower posterior cervical tenderness. diffuse tenderness in bilateral trapezial region, left > right. Some tenderness into left scapular region.  No abnormal lesions on exposed skin.   Strength: Side Biceps Triceps Deltoid Interossei Grip Wrist Ext. Wrist Flex.  R 5 5 5 5 5 5 5   L 5 5 5 5 5 5 5    Side Iliopsoas Quads Hamstring PF DF EHL  R 5 5 5 5 5 5   L 5 5 5 5 5 5    Reflexes are 2+ and symmetric at the biceps, brachioradialis, patella and achilles.   Hoffman's is absent.  Clonus is not present.   Bilateral upper and lower extremity sensation is intact to light touch.     Gait is normal.    Medical Decision Making  Imaging: MRI of cervical spine dated 05/13/23:  FINDINGS: Alignment: Straightening of cervical lordosis, not significantly changed from the February radiographs. Mild degenerative anterolisthesis of C7-T1 ( 2-3 mm).   Vertebrae: C4-C5 disc arthroplasty and C6-C7 anterior interbody fusion implant. Moderate hardware artifact at the former. Mild susceptibility artifact at the latter and evidence of solid interbody arthrodesis there.   Background bone marrow signal is within normal limits. No marrow edema or evidence of acute osseous abnormality.   Cord: No spinal cord signal abnormality despite degenerative lower cervical cord mass effect which is detailed below.   Visible upper thoracic spinal canal and cord appear negative.   Posterior Fossa, vertebral arteries, paraspinal tissues: Cervicomedullary junction is within normal limits. Negative visible posterior fossa. Preserved major vascular flow voids in the neck with mildly dominant right vertebral artery.  Negative visible neck soft tissues, lung apices.   Disc levels:   C2-C3: Negative disc. Mild  to moderate facet and ligament flavum hypertrophy. Mild left C3 foraminal stenosis.   C3-C4: Negative disc. Mild foraminal endplate spurring, facet hypertrophy. Mild bilateral C4 neural foraminal stenosis.   C4-C5: Disc arthroplasty. Hardware artifact limits detail at this level. Mild to moderate facet hypertrophy is evident. Probably no spinal or high-grade foraminal stenosis.   C5-C6: Disc space loss with circumferential disc osteophyte complex. Posterior broad-based right paracentral component of disc (series 19, image 19). Mild facet hypertrophy. Mild to moderate spinal stenosis and right hemi cord mass effect. Mild right C6 foraminal stenosis.   C6-C7: ACDF with evidence of solid interbody arthrodesis. Left paracentral endplate osteophyte (series 19, image 25), continuing into the left neural foramen. Mild spinal stenosis and left hemi cord mass effect. Moderate left C7 foraminal stenosis.   C7-T1: Mild anterolisthesis. Moderate to severe facet hypertrophy (on the left series 16, image 12). Mild leftward disc bulging/pseudo disc. No spinal stenosis. But moderate left C8 foraminal stenosis (series 16, image 13).   Mild upper thoracic facet hypertrophy without stenosis.   IMPRESSION: 1. C4-C5 disc arthroplasty with moderate susceptibility artifact limiting detail at that level. Probably no spinal or high-grade foraminal stenosis.   2. C6-C7 ACDF with evidence of solid interbody arthrodesis, but residual leftward osteophytosis resulting in mild spinal stenosis and moderate left C7 foraminal stenosis. Mild left hemi cord mass effect.   3. 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 Up to moderate spinal stenosis and right hemi cord mass effect, but no cord signal abnormality. Only mild right C6 foraminal stenosis.   4. Mild anterolisthesis  at C7-T1 with moderate to severe facet degeneration and moderate left C8 neural foraminal stenosis.     Electronically Signed   By: Odessa Fleming M.D.   On: 05/25/2023 06:55  I have personally reviewed the images and agree with the above interpretation.  Above imaging reviewed with Dr. Myer Haff.   Assessment and Plan: [name] is a pleasant 62 y.o. female has history of cervical disc arthoplasty C4-C5 and ACDF C6-C7 in 2011. She did well until fall in August of 2023.   She dislocated her jaw with fall in August of 2023. Since that time, she has constant neck pain that radiates into her shoulder blades, left > right. No arm pain. She notes headaches associated with her neck pain- they have gotten worse in last week.  At C6-C7 she has residual bone spur on left resulting in mild spinal stenosis and moderate left C7 foraminal stenosis. Also with right spur/disc at C5-C6 with moderate central stenosis and mild right C6 foraminal stenosis.   Dr. Myer Haff thinks most of her pain is from C6-C7. He does not think her headaches are related to her neck pain.   Treatment options discussed with patient and following plan made:   - Referral to PMR at [facility] to consider cervical injections (left C6-C7). May also be a candidate for TPI? Message sent to [facility] to see if she can help get patient seen quickly.  - Okay to continue with cervical PT.  - Can try adding lidoderm patches. Reviewed usage and side effects. Prescription sent to pharmacy.  - Continue on lyrica from PCP. May ask her about increasing the dose.  - Referral to neurology recommended for evaluation of headaches. She declines.  - If she has significant improvement with above injection, then recommend she follow up with Dr. Myer Haff to discuss possible surgery options.  - For now, she will follow up with me in  6 weeks.   Of note, she was very tearful in exam room due to pain. Advised to go to ED if pain becomes intolerable.   Will send  copy of this note to Dr. Wilhelmenia Blase at patient's request. She is with [facility].   I spent a total of 45 minutes in face-to-face and non-face-to-face activities related to this patient's care today including review of outside records, review of imaging, review of symptoms, physical exam, discussion of differential diagnosis, discussion of treatment options, and documentation.   Thank you for involving me in the care of this patient.   Drake Leach PA-C Dept. of Neurosurgery

## 2023-08-02 ENCOUNTER — Encounter: Payer: Self-pay | Admitting: Orthopedic Surgery

## 2023-08-02 ENCOUNTER — Ambulatory Visit: Payer: Medicare Other | Admitting: Orthopedic Surgery

## 2023-08-02 VITALS — BP 128/82 | Ht 63.0 in | Wt 184.0 lb

## 2023-08-02 DIAGNOSIS — Z981 Arthrodesis status: Secondary | ICD-10-CM | POA: Diagnosis not present

## 2023-08-02 DIAGNOSIS — M47812 Spondylosis without myelopathy or radiculopathy, cervical region: Secondary | ICD-10-CM

## 2023-08-02 DIAGNOSIS — M50222 Other cervical disc displacement at C5-C6 level: Secondary | ICD-10-CM | POA: Diagnosis not present

## 2023-08-02 DIAGNOSIS — M4802 Spinal stenosis, cervical region: Secondary | ICD-10-CM | POA: Diagnosis not present

## 2023-08-03 NOTE — Progress Notes (Unsigned)
Celso Amy, PA-C [address]  [address]  [place], [place] [address]  Main: [phone]  Fax: [phone]   Primary Care Physician: Dorcas Carrow, DO  Primary Gastroenterologist:  Celso Amy, PA-C / Dr. Lannette Donath    CC: Follow-up dysphagia and chronic constipation  HPI: Kaitlyn Frazier is a 62 y.o. female returns for 62-month follow-up of chronic constipation and dysphagia.  3 months ago she was started on MiraLAX for constipation.  She takes omeprazole 20-40 Mg daily as needed for GERD.  Previous history of H. pylori and hiatal hernia 5 years ago.  Barium swallow with tablet 04/25/2023 showed small hiatal hernia, otherwise normal.  No GERD.  EGD 07/26/2023 by Dr. Allegra Lai showed a small hiatal hernia, normal stomach and duodenum.  Biopsies negative for celiac and H. pylori.  No Barrett's.  Colonoscopy 07/26/2023 was normal.  No polyps.  Biopsies negative for microscopic colitis.  10-year repeat colonoscopy.  CT abdomen pelvis with contrast done 04/2022, to evaluate abdominal pain, showed hepatic steatosis. No other abnormality. Previous hysterectomy. No gallstones or bowel inflammation. She has had previous inguinal hernia repair.   Current Outpatient Medications  Medication Sig Dispense Refill   albuterol (VENTOLIN HFA) 108 (90 Base) MCG/ACT inhaler Inhale 1-2 puffs into the lungs every 6 (six) hours as needed. 18 g 0   buPROPion (WELLBUTRIN SR) 150 MG 12 hr tablet Take 1 tablet (150 mg total) by mouth 2 (two) times daily. 180 tablet 0   Cholecalciferol (VITAMIN D-3) 25 MCG (1000 UT) CAPS Take by mouth.     DULoxetine (CYMBALTA) 60 MG capsule Take 1 capsule (60 mg total) by mouth daily. 90 capsule 0   hydrochlorothiazide (HYDRODIURIL) 25 MG tablet Take 1 tablet (25 mg total) by mouth daily. 90 tablet 1   lidocaine (LIDODERM) 5 % Place 1 patch onto the skin every 12 (twelve) hours. Remove after 12 hours and then you can put on a new patch 12 hours later (12 hours on  and then 12 hours off). 30 patch 0   meloxicam (MOBIC) 15 MG tablet Take 1 tablet (15 mg total) by mouth daily. 90 tablet 1   metroNIDAZOLE (METROGEL) 1 % gel Apply topically daily. 45 g 3   naloxone (NARCAN) nasal spray 4 mg/0.1 mL Spray into nose if found unresponsive after taking opioid medications. 1 each 1   omeprazole (PRILOSEC) 20 MG capsule Take 2 capsules (40 mg total) by mouth daily before breakfast. 90 capsule 1   pregabalin (LYRICA) 150 MG capsule Take 1 capsule (150 mg total) by mouth 3 (three) times daily. 90 capsule 3   traMADol HCl 100 MG TABS Take 100 mg by mouth 3 (three) times daily as needed. 90 tablet 0   zolpidem (AMBIEN) 5 MG tablet TAKE 1 TABLET(5 MG) BY MOUTH AT BEDTIME AS NEEDED FOR SLEEP 30 tablet 0   No current facility-administered medications for this visit.    Allergies as of 08/04/2023 - Review Complete 08/02/2023  Allergen Reaction Noted   Gabapentin Other (See Comments), Hives, and Rash 04/16/2022   Amitriptyline Other (See Comments) 04/16/2022   Baclofen Other (See Comments) 04/16/2022   Cyclobenzaprine Other (See Comments) 04/16/2022   Doxepin  11/24/2022   Latex Hives 04/16/2022   Norflex tablets [orphenadrine]  05/24/2022   Pollen extract Itching 04/16/2022   Tizanidine Other (See Comments) 08/04/2022   Other Rash 04/16/2022    Past Medical History:  Diagnosis Date   Abnormal vaginal Pap smear  Depression    Fibromyalgia    hx of Conversion disorder    hx of Peritonitis (HCC)    Mitral valve regurgitation    Sleep apnea    Thyroid nodule     Past Surgical History:  Procedure Laterality Date   ABDOMINAL HYSTERECTOMY     ANTERIOR / POSTERIOR COMBINED FUSION CERVICAL SPINE     ANTERIOR LAT LUMBAR FUSION     BIOPSY  07/26/2023   Procedure: BIOPSY;  Surgeon: Toney Reil, MD;  Location: [facility] ENDOSCOPY;  Service: Gastroenterology;;   BREAST BIOPSY N/A    pt unsure which breast 10 years ago-fibrous tissue   CESAREAN SECTION      COLONOSCOPY WITH PROPOFOL N/A 07/26/2023   Procedure: COLONOSCOPY WITH PROPOFOL;  Surgeon: Toney Reil, MD;  Location: [facility] ENDOSCOPY;  Service: Gastroenterology;  Laterality: N/A;   ESOPHAGOGASTRODUODENOSCOPY  07/26/2023   Procedure: ESOPHAGOGASTRODUODENOSCOPY (EGD);  Surgeon: Toney Reil, MD;  Location: [facility] ENDOSCOPY;  Service: Gastroenterology;;   INGUINAL HERNIA REPAIR     NOSE SURGERY     TUMOR REMOVAL     on her SI joint    Review of Systems:    All systems reviewed and negative except where noted in HPI.   Physical Examination:   There were no vitals taken for this visit.  General: Well-nourished, well-developed in no acute distress.  Lungs: Clear to auscultation bilaterally. Non-labored. Heart: Regular rate and rhythm, no murmurs rubs or gallops.  Abdomen: Bowel sounds are normal; Abdomen is Soft; No hepatosplenomegaly, masses or hernias;  No Abdominal Tenderness; No guarding or rebound tenderness. Neuro: Alert and oriented x 3.  Grossly intact.  Psych: Alert and cooperative, normal mood and affect.   Imaging Studies: See HPI.  Assessment and Plan:   Kaitlyn Frazier is a 62 y.o. y/o female returns for 36-month follow-up of GERD and constipation.  Recent EGD and colonoscopy were normal.  Biopsies negative for celiac, H. pylori, and microscopic colitis.  Repeat screening colonoscopy in 10 years.  1.  GERD   2.  Chronic constipation   3.  Colon cancer screening  Repeat colonoscopy in 10 years.    Celso Amy, PA-C  Follow up ***  BP check ***

## 2023-08-04 ENCOUNTER — Encounter: Payer: Self-pay | Admitting: Physician Assistant

## 2023-08-04 ENCOUNTER — Ambulatory Visit (INDEPENDENT_AMBULATORY_CARE_PROVIDER_SITE_OTHER): Payer: Medicare Other | Admitting: Physician Assistant

## 2023-08-04 VITALS — BP 133/85 | HR 84 | Temp 98.3°F | Ht 63.0 in | Wt 189.2 lb

## 2023-08-04 DIAGNOSIS — K5909 Other constipation: Secondary | ICD-10-CM

## 2023-08-04 DIAGNOSIS — K219 Gastro-esophageal reflux disease without esophagitis: Secondary | ICD-10-CM | POA: Diagnosis not present

## 2023-08-04 MED ORDER — LUBIPROSTONE 8 MCG PO CAPS
8.0000 ug | ORAL_CAPSULE | Freq: Two times a day (BID) | ORAL | 2 refills | Status: DC
Start: 2023-08-04 — End: 2023-08-16

## 2023-08-04 MED ORDER — OMEPRAZOLE 40 MG PO CPDR
40.0000 mg | DELAYED_RELEASE_CAPSULE | Freq: Every day | ORAL | 3 refills | Status: DC
Start: 2023-08-04 — End: 2023-12-22

## 2023-08-12 NOTE — Progress Notes (Unsigned)
Referring Physician:  Dorcas Carrow, DO [address] [place],  [place] [address]  Primary Physician:  Dorcas Carrow, DO  History of Present Illness: 08/12/2023 [name] is here today with a chief complaint of ***  neck pain and headaches with radiation into her left shoulder blade   Duration: 1 year Location: *** Quality: *** Severity: ***  Precipitating: aggravated by bending, lifting, twisting, laying down, squatting, sneezing, turning her head  Modifying factors: made better by rest and ice.  Weakness: none Timing: *** Bowel/Bladder Dysfunction: none  Conservative measures:  Physical therapy: had osteopathic manipulative medicine done by PCP, she is doing [facility].   Multimodal medical therapy including regular antiinflammatories: ultram, lyrcia, cymbalta, diclofenac, prednisone, Mobic, Lidocaine patches   Injections: has not done epidural steroid injections  Past Surgery: History of lumbar fusion L5-S1 2010 History of cervical disc arthoplasty C4-C5 and ACDF C6-C7.  2011  [name] has a history of HTN, asthma, OSA, hyperlipidemia, and chronic pain.    She has a history of cervical disc arthoplasty C4-C5 and ACDF C6-C7 in 2011. She did well until fall in August of 2023.    Last seen by me on 06/21/23 for neck pain and headaches that started after dislocating her jaw with fall in August of 2023.    She has a residual bone spur on left at C6-C7 resulting in mild spinal stenosis and moderate left C7 foraminal stenosis. Also with right spur/disc at C5-C6 with moderate central stenosis and mild right C6 foraminal stenosis.    Dr. Myer Haff thinks most of her pain is from C6-C7. He does not think her headaches are related to her neck pain.    She was referred to PMR for injections and is here for follow up. Referral to neurology recommended for headaches and she declined.    She had a left C7-T1 IL ESI by Dr. Ernestina Penna on 07/20/23.     She had improvement with her injection! She still has some constant neck pain with radiation into her left shoulder blade. No arm pain. Headaches are still present, but she is having less of them. No dizziness.    She has been able to wear night brace that jaw specialist gave her with no increased neck pain.    This pain started after fall at work in August of 2023. She was doing well after her cervical surgery and no significant pain in her neck prior to the fall.        Kaitlyn Frazier has ***no symptoms of cervical myelopathy.  The symptoms are causing a significant impact on the patient's life.   I have utilized the care everywhere function in epic to review the outside records available from external health systems.  Review of Systems:  A 10 point review of systems is negative, except for the pertinent positives and negatives detailed in the HPI.  Past Medical History: Past Medical History:  Diagnosis Date   Abnormal vaginal Pap smear    Depression    Fibromyalgia    hx of Conversion disorder    hx of Peritonitis (HCC)    Mitral valve regurgitation    Sleep apnea    Thyroid nodule     Past Surgical History: Past Surgical History:  Procedure Laterality Date   ABDOMINAL HYSTERECTOMY     ANTERIOR / POSTERIOR COMBINED FUSION CERVICAL SPINE     ANTERIOR LAT LUMBAR FUSION     BIOPSY  07/26/2023  Procedure: BIOPSY;  Surgeon: Toney Reil, MD;  [facility]: [facility] ENDOSCOPY;  Service: Gastroenterology;;   BREAST BIOPSY N/A    pt unsure which breast 10 years ago-fibrous tissue   CESAREAN SECTION     COLONOSCOPY WITH PROPOFOL N/A 07/26/2023   Procedure: COLONOSCOPY WITH PROPOFOL;  Surgeon: Toney Reil, MD;  Location: [facility] ENDOSCOPY;  Service: Gastroenterology;  Laterality: N/A;   ESOPHAGOGASTRODUODENOSCOPY  07/26/2023   Procedure: ESOPHAGOGASTRODUODENOSCOPY (EGD);  Surgeon: Toney Reil, MD;  Location: [facility] ENDOSCOPY;  Service: Gastroenterology;;    INGUINAL HERNIA REPAIR     NOSE SURGERY     TUMOR REMOVAL     on her SI joint    Allergies: Allergies as of 08/16/2023 - Review Complete 08/04/2023  Allergen Reaction Noted   Gabapentin Other (See Comments), Hives, and Rash 04/16/2022   Amitriptyline Other (See Comments) 04/16/2022   Baclofen Other (See Comments) 04/16/2022   Cyclobenzaprine Other (See Comments) 04/16/2022   Doxepin  11/24/2022   Latex Hives 04/16/2022   Norflex tablets [orphenadrine]  05/24/2022   Pollen extract Itching 04/16/2022   Tizanidine Other (See Comments) 08/04/2022   Other Rash 04/16/2022    Medications:  Current Outpatient Medications:    albuterol (VENTOLIN HFA) 108 (90 Base) MCG/ACT inhaler, Inhale 1-2 puffs into the lungs every 6 (six) hours as needed., Disp: 18 g, Rfl: 0   buPROPion (WELLBUTRIN SR) 150 MG 12 hr tablet, Take 1 tablet (150 mg total) by mouth 2 (two) times daily., Disp: 180 tablet, Rfl: 0   Cholecalciferol (VITAMIN D-3) 25 MCG (1000 UT) CAPS, Take by mouth., Disp: , Rfl:    DULoxetine (CYMBALTA) 60 MG capsule, Take 1 capsule (60 mg total) by mouth daily., Disp: 90 capsule, Rfl: 0   hydrochlorothiazide (HYDRODIURIL) 25 MG tablet, Take 1 tablet (25 mg total) by mouth daily., Disp: 90 tablet, Rfl: 1   lidocaine (LIDODERM) 5 %, Place 1 patch onto the skin every 12 (twelve) hours. Remove after 12 hours and then you can put on a new patch 12 hours later (12 hours on and then 12 hours off)., Disp: 30 patch, Rfl: 0   lubiprostone (AMITIZA) 8 MCG capsule, Take 1 capsule (8 mcg total) by mouth 2 (two) times daily with a meal., Disp: 60 capsule, Rfl: 2   meloxicam (MOBIC) 15 MG tablet, Take 1 tablet (15 mg total) by mouth daily., Disp: 90 tablet, Rfl: 1   metroNIDAZOLE (METROGEL) 1 % gel, Apply topically daily., Disp: 45 g, Rfl: 3   naloxone (NARCAN) nasal spray 4 mg/0.1 mL, Spray into nose if found unresponsive after taking opioid medications., Disp: 1 each, Rfl: 1   omeprazole (PRILOSEC) 40 MG  capsule, Take 1 capsule (40 mg total) by mouth daily., Disp: 90 capsule, Rfl: 3   pregabalin (LYRICA) 150 MG capsule, Take 1 capsule (150 mg total) by mouth 3 (three) times daily., Disp: 90 capsule, Rfl: 3   traMADol HCl 100 MG TABS, Take 100 mg by mouth 3 (three) times daily as needed., Disp: 90 tablet, Rfl: 0   zolpidem (AMBIEN) 5 MG tablet, TAKE 1 TABLET(5 MG) BY MOUTH AT BEDTIME AS NEEDED FOR SLEEP, Disp: 30 tablet, Rfl: 0  Social History: Social History   Tobacco Use   Smoking status: Never    Passive exposure: Never   Smokeless tobacco: Never   Tobacco comments:    Sometimes vaping  Vaping Use   Vaping status: Some Days   Substances: Nicotine  Substance Use Topics   Alcohol use: Never  Drug use: Never    Family Medical History: Family History  Problem Relation Age of Onset   Alcohol abuse Mother    Bipolar disorder Mother    Uterine cancer Paternal Aunt        70s   Ovarian cancer Paternal Aunt        13s   Ovarian cancer Paternal Aunt        51s   Ovarian cancer Cousin        49s   Tourette syndrome Son     Physical Examination: There were no vitals filed for this visit.  General: Patient is in no apparent distress. Attention to examination is appropriate.  Neck:   Supple.  Full range of motion.  Respiratory: Patient is breathing without any difficulty.   NEUROLOGICAL:     Awake, alert, oriented to person, place, and time.  Speech is clear and fluent.   Cranial Nerves: Pupils equal round and reactive to light.  Facial tone is symmetric.  Facial sensation is symmetric. Shoulder shrug is symmetric. Tongue protrusion is midline.  There is no pronator drift.  Strength: Side Biceps Triceps Deltoid Interossei Grip Wrist Ext. Wrist Flex.  R 5 5 5 5 5 5 5   L 5 5 5 5 5 5 5    Side Iliopsoas Quads Hamstring PF DF EHL  R 5 5 5 5 5 5   L 5 5 5 5 5 5    Reflexes are ***2+ and symmetric at the biceps, triceps, brachioradialis, patella and achilles.   Hoffman's is  absent.   Bilateral upper and lower extremity sensation is intact to light touch.    No evidence of dysmetria noted.  Gait is normal.     Medical Decision Making  Imaging: ***  I have personally reviewed the images and agree with the above interpretation.  Assessment and Plan: Ms. Peaden is a pleasant 62 y.o. female with ***    Thank you for involving me in the care of this patient.      Toddy Boyd K. Myer Haff MD, [facility] Neurosurgery

## 2023-08-16 ENCOUNTER — Ambulatory Visit (INDEPENDENT_AMBULATORY_CARE_PROVIDER_SITE_OTHER): Payer: Medicare Other | Admitting: Neurosurgery

## 2023-08-16 ENCOUNTER — Encounter: Payer: Self-pay | Admitting: Neurosurgery

## 2023-08-16 VITALS — BP 128/72 | Ht 63.0 in | Wt 186.4 lb

## 2023-08-16 DIAGNOSIS — Z981 Arthrodesis status: Secondary | ICD-10-CM

## 2023-08-16 DIAGNOSIS — M542 Cervicalgia: Secondary | ICD-10-CM

## 2023-08-16 DIAGNOSIS — M5412 Radiculopathy, cervical region: Secondary | ICD-10-CM

## 2023-08-16 DIAGNOSIS — M545 Low back pain, unspecified: Secondary | ICD-10-CM

## 2023-08-16 DIAGNOSIS — G8929 Other chronic pain: Secondary | ICD-10-CM | POA: Diagnosis not present

## 2023-08-25 ENCOUNTER — Telehealth: Payer: Self-pay

## 2023-08-25 DIAGNOSIS — M542 Cervicalgia: Secondary | ICD-10-CM

## 2023-08-25 DIAGNOSIS — M545 Low back pain, unspecified: Secondary | ICD-10-CM

## 2023-08-25 DIAGNOSIS — Z981 Arthrodesis status: Secondary | ICD-10-CM

## 2023-08-25 DIAGNOSIS — M5412 Radiculopathy, cervical region: Secondary | ICD-10-CM

## 2023-08-25 NOTE — Telephone Encounter (Signed)
-----   Message from [facility] sent at 08/24/2023  7:14 AM EDT ----- That's not a discharge.  It has to say "discharge" or "poor therapy candidate" or something more explicit to be a discharge.  Would send her back for reevaluation and I'll see her after ----- Message ----- From: Sharlot Gowda, RN Sent: 08/23/2023   4:36 PM EDT To: Venetia Night, MD  See top of page 3

## 2023-09-07 NOTE — Telephone Encounter (Signed)
FYI Sam Hatchel from [facility] calling that patient missed her evaluation on 09/04/2023, he tried rescheduling her appt but she said she could not schedule anything at this time.

## 2023-10-06 ENCOUNTER — Other Ambulatory Visit: Payer: Self-pay | Admitting: Family Medicine

## 2023-10-06 NOTE — Telephone Encounter (Signed)
Requested Prescriptions  Pending Prescriptions Disp Refills   buPROPion (WELLBUTRIN SR) 150 MG 12 hr tablet [Pharmacy Med Name: BUPROPION SR 150MG  TABLETS (12 H)] 180 tablet 0    Sig: TAKE 1 TABLET(150 MG) BY MOUTH TWICE DAILY     Psychiatry: Antidepressants - bupropion Passed - 10/06/2023  6:22 AM      Passed - Cr in normal range and within 360 days    Creatinine, Ser  Date Value Ref Range Status  07/04/2023 0.73 0.57 - 1.00 mg/dL Final         Passed - AST in normal range and within 360 days    AST  Date Value Ref Range Status  05/31/2023 20 0 - 40 IU/L Final         Passed - ALT in normal range and within 360 days    ALT  Date Value Ref Range Status  05/31/2023 23 0 - 32 IU/L Final         Passed - Completed PHQ-2 or PHQ-9 in the last 360 days      Passed - Last BP in normal range    BP Readings from Last 1 Encounters:  08/16/23 128/72         Passed - Valid encounter within last 6 months    Recent Outpatient Visits           3 months ago Osteoarthritis of spine with radiculopathy, cervical region   [facility] [place], Megan P, DO   3 months ago Osteoarthritis of spine with radiculopathy, cervical region   [facility], Erin E, PA-C   4 months ago Osteoarthritis of spine with radiculopathy, cervical region   [facility] [place], Megan P, DO   5 months ago Insomnia, unspecified type   [facility] [place], Megan P, DO   5 months ago PTSD (post-traumatic stress disorder)   [facility] [place], Megan P, DO

## 2023-10-10 ENCOUNTER — Telehealth: Payer: Self-pay | Admitting: Neurosurgery

## 2023-10-10 NOTE — Telephone Encounter (Signed)
Patient has called stating that she was set up to go to [facility] to see Sam. Sam can only see the patient once a week and from her understanding with Dr.Yarbrough, she is needing a more intensive PT. Wanting to have a new referral sent to another PT in [place] preferably so she can be seen more frequently. Please advise

## 2023-10-10 NOTE — Telephone Encounter (Signed)
Patient spoke with [facility] in [place]- is scheduled for appointments until Dec. 11th. Wanted to go ahead and make her f/u with Dr.Yarbrough unless something changes. Patient also would like to know about the surgery schedule around that time for her to plan time off. Informed the patient someone could call her back regarding this information

## 2023-10-10 NOTE — Telephone Encounter (Signed)
Spoke to patient and explained she will need to be discharged from PT before we can put her on the books for surgery. She is coming to her appointment on December 12 and we will see when the next open slot is for surgery. If she is not discharged from PT she will need to reschedule the follow up appointment. Patient voiced understanding.

## 2023-11-16 NOTE — Telephone Encounter (Signed)
Per [facility] she had an appt today but rescheduled and her next PT visit is on 12/30.  I called the patient to reschedule her appt and she confirmed to push it out to 12/29/2023.

## 2023-11-17 ENCOUNTER — Ambulatory Visit: Payer: Medicare Other | Admitting: Neurosurgery

## 2023-12-03 ENCOUNTER — Other Ambulatory Visit: Payer: Self-pay | Admitting: Family Medicine

## 2023-12-03 DIAGNOSIS — G4700 Insomnia, unspecified: Secondary | ICD-10-CM

## 2023-12-08 ENCOUNTER — Ambulatory Visit: Payer: Self-pay

## 2023-12-08 ENCOUNTER — Encounter: Payer: Self-pay | Admitting: Family Medicine

## 2023-12-08 ENCOUNTER — Other Ambulatory Visit: Payer: Self-pay | Admitting: Family Medicine

## 2023-12-08 DIAGNOSIS — F4310 Post-traumatic stress disorder, unspecified: Secondary | ICD-10-CM

## 2023-12-08 NOTE — Telephone Encounter (Signed)
 Requested medications are due for refill today.  Yes  Requested medications are on the active medications list.  Lyrica  is, hydroxyzine  is not  Last refill. Lyruca 06/22/2023 #90 3 rf  Future visit scheduled.   yes  Notes to clinic.  Pt is out of both medications. Pt has been out of Lyrica  for 3 days.  Pt is also requesting Ativan d/t recent stress life events.    Requested Prescriptions  Pending Prescriptions Disp Refills   hydrOXYzine  (ATARAX ) 50 MG tablet [Pharmacy Med Name: HYDROXYZINE  HCL 50MG  TABS (WHITE)] 60 tablet 4    Sig: TAKE 1/2 TO 1 TABLET(25 TO 50 MG) BY MOUTH AT BEDTIME     Ear, Nose, and Throat:  Antihistamines 2 Passed - 12/08/2023 10:28 AM      Passed - Cr in normal range and within 360 days    Creatinine, Ser  Date Value Ref Range Status  07/04/2023 0.73 0.57 - 1.00 mg/dL Final         Passed - Valid encounter within last 12 months    Recent Outpatient Visits           5 months ago Osteoarthritis of spine with radiculopathy, cervical region   [facility] [place], Megan P, DO   5 months ago Osteoarthritis of spine with radiculopathy, cervical region   [facility], Erin E, PA-C   6 months ago Osteoarthritis of spine with radiculopathy, cervical region   [facility] [place], Megan P, DO   7 months ago Insomnia, unspecified type   [place] [place], Megan P, DO   7 months ago PTSD (post-traumatic stress disorder)   [facility] [place], Megan P, DO       Future Appointments             In 4 days Cannady, Jolene T, NP [place]             pregabalin  (LYRICA ) 150 MG capsule [Pharmacy Med Name: PREGABALIN  150MG  CAPSULES] 90 capsule     Sig: TAKE 1 CAPSULE(150 MG) BY MOUTH THREE TIMES DAILY     Not Delegated - Neurology:  Anticonvulsants - Controlled - pregabalin  Failed - 12/08/2023 10:28 AM       Failed - This refill cannot be delegated      Passed - Cr in normal range and within 360 days    Creatinine, Ser  Date Value Ref Range Status  07/04/2023 0.73 0.57 - 1.00 mg/dL Final         Passed - Completed PHQ-2 or PHQ-9 in the last 360 days      Passed - Valid encounter within last 12 months    Recent Outpatient Visits           5 months ago Osteoarthritis of spine with radiculopathy, cervical region   [facility] [place], Megan P, DO   5 months ago Osteoarthritis of spine with radiculopathy, cervical region   [place] [facility] Mecum, Erin E, PA-C   6 months ago Osteoarthritis of spine with radiculopathy, cervical region   [facility] [place], Megan P, DO   7 months ago Insomnia, unspecified type   [facility] [place], Megan P, DO   7 months ago PTSD (post-traumatic stress disorder)   [facility] [place], Megan P, DO  Future Appointments             In 4 days Cannady, Jolene T, NP [facility]

## 2023-12-08 NOTE — Telephone Encounter (Deleted)
 Summary: Rx refill request follow up and pain   Pt requesting for refill to be approved for pregabalin  (LYRICA ) 150 MG capsule. Pt unsure as to why it was denied and pt states she will now be going 4 days without the medication and is going through withdrawals. Pt is having nausea, insomnia, and rebound pain- rating her pain at a 7.  Pt informed message from [facility] was sent to provider and waiting on provider response.  Pt requesting this be done before tomorrow and requesting another provider to look at refill request if at all possible.

## 2023-12-08 NOTE — Telephone Encounter (Signed)
  Chief Complaint: Out of medication Symptoms: none Frequency: 3 days Pertinent Negatives: Patient denies  Disposition: [] ED /[] Urgent Care (no appt availability in office) / [] Appointment(In office/virtual)/ []  [place] [facility]/ [] Home Care/ [] Refused Recommended Disposition /[] [place] [facility]/ [x]  Follow-up with PCP Additional Notes: Pt is out of Lyrica  and atarax . Pt is also requesting ativan to take as needed. Pt states that her husband has asked her for a divorce, which is adding to her stress. Pt has an appt for 12/12/2023.  Reason for Disposition  [1] Prescription refill request for ESSENTIAL medicine (i.e., likelihood of harm to patient if not taken) AND [2] triager unable to refill per department policy  Answer Assessment - Initial Assessment Questions 1. DRUG NAME: What medicine do you need to have refilled?     Lyrica , atarax  also requesting ativan 2. REFILLS REMAINING: How many refills are remaining? (Note: The label on the medicine or pill bottle will show how many refills are remaining. If there are no refills remaining, then a renewal may be needed.)     none  4. PRESCRIBING HCP: Who prescribed it? Reason: If prescribed by specialist, call should be referred to that group.     Dr. Vicci  Protocols used: Medication Refill and Renew[facility] Call-A-AH

## 2023-12-08 NOTE — Telephone Encounter (Signed)
 Summary: Rx refill request follow up and pain   Pt requesting for refill to be approved for pregabalin  (LYRICA ) 150 MG capsule. Pt unsure as to why it was denied and pt states she will now be going 4 days without the medication and is going through withdrawals. Pt is having nausea, insomnia, and rebound pain- rating her pain at a 7.  Pt informed message from [facility] was sent to provider and waiting on provider response.  Pt requesting this be done before tomorrow and requesting another provider to look at refill request if at all possible.

## 2023-12-08 NOTE — Telephone Encounter (Signed)
 This encounter was created in error - please disregard.

## 2023-12-09 ENCOUNTER — Other Ambulatory Visit: Payer: Self-pay | Admitting: Family Medicine

## 2023-12-09 MED ORDER — PREGABALIN 150 MG PO CAPS: 150.0000 mg | ORAL_CAPSULE | Freq: Three times a day (TID) | ORAL | 0 refills | Status: DC

## 2023-12-09 MED ORDER — MELOXICAM 15 MG PO TABS: 15.0000 mg | ORAL_TABLET | Freq: Every day | ORAL | 0 refills | Status: DC

## 2023-12-09 MED ORDER — HYDROCHLOROTHIAZIDE 25 MG PO TABS: 25.0000 mg | ORAL_TABLET | Freq: Every day | ORAL | 0 refills | Status: DC

## 2023-12-09 MED ORDER — BUPROPION HCL ER (SR) 150 MG PO TB12: 150.0000 mg | ORAL_TABLET | Freq: Two times a day (BID) | ORAL | 0 refills | Status: DC

## 2023-12-09 MED ORDER — DULOXETINE HCL 60 MG PO CPEP
60.0000 mg | ORAL_CAPSULE | Freq: Every day | ORAL | 0 refills | Status: DC
Start: 2023-12-09 — End: 2023-12-22

## 2023-12-09 NOTE — Telephone Encounter (Signed)
 Atarax not on medication list. Lorazepam needs appointment. Overdue for follow up. 30 days of her medicine sent to her pharmacy. Needs follow up with me, not with another provider in the office

## 2023-12-09 NOTE — Telephone Encounter (Signed)
 Pt has an appointment scheduled on 12/12/2023 @ 2:00 pm.

## 2023-12-09 NOTE — Addendum Note (Signed)
 Addended by: Dorcas Carrow on: 12/09/2023 09:34 AM   Modules accepted: Orders

## 2023-12-12 ENCOUNTER — Ambulatory Visit: Payer: Medicare Other | Admitting: Nurse Practitioner

## 2023-12-13 ENCOUNTER — Ambulatory Visit: Payer: Medicare Other | Admitting: Neurosurgery

## 2023-12-13 ENCOUNTER — Other Ambulatory Visit: Payer: Self-pay

## 2023-12-13 ENCOUNTER — Encounter: Payer: Self-pay | Admitting: Neurology

## 2023-12-13 ENCOUNTER — Encounter: Payer: Self-pay | Admitting: Neurosurgery

## 2023-12-13 VITALS — BP 179/90 | Ht 63.0 in | Wt 186.0 lb

## 2023-12-13 DIAGNOSIS — Z981 Arthrodesis status: Secondary | ICD-10-CM

## 2023-12-13 DIAGNOSIS — M25512 Pain in left shoulder: Secondary | ICD-10-CM

## 2023-12-13 DIAGNOSIS — R1310 Dysphagia, unspecified: Secondary | ICD-10-CM

## 2023-12-13 DIAGNOSIS — R200 Anesthesia of skin: Secondary | ICD-10-CM | POA: Diagnosis not present

## 2023-12-13 DIAGNOSIS — R202 Paresthesia of skin: Secondary | ICD-10-CM

## 2023-12-13 MED ORDER — DIAZEPAM 5 MG PO TABS: 5.0000 mg | ORAL_TABLET | Freq: Once | ORAL | 0 refills | Status: AC

## 2023-12-13 NOTE — Progress Notes (Signed)
 Referring Physician:  Vicci Duwaine SQUIBB, DO [address] [place],  [place] [address]  Primary Physician:  Vicci Duwaine SQUIBB, DO  History of Present Illness: 12/13/2023 She has been having some ongoing issues with numbness in her hands.  She wakes up with numbness in both hands.  She has an issue where she sleeps on her side or lays on her back where she has difficulty with her throat.  This been ongoing and is very difficult for her.  She is not having as much shooting severe pain down her left arm but is having pain around her left shoulder and with movement of her left shoulder.  She also reported some numbness on the bottom of her left foot.  This been intermittent.   08/16/2023 [name] is here today with a chief complaint of back and neck pain.  She additionally suffers from headaches and pain that radiates both in between her shoulder blades and also into her left shoulder blade and down her left arm.  It feels like an ice pick an aching soreness.  It has been ongoing for approximately 1 year.  Bending, lifting, twisting, laying down, squatting, sneezing, and turning her head make it worse.  Rest and ice it made it better.   She had back surgery in the past which helped her, but she continues to have some low back pain that is worsened by activities including lifting.  Bowel/Bladder Dysfunction: none  Conservative measures:  Physical therapy: had osteopathic manipulative medicine done by PCP, she is doing PT at [facility] without relief.   Multimodal medical therapy including regular antiinflammatories: ultram , lyrcia, cymbalta , diclofenac, prednisone , Mobic , Lidocaine  patches   Injections: has not done epidural steroid injections  Past Surgery: History of lumbar fusion L5-S1 2010 History of cervical disc arthoplasty C4-C5 and ACDF C6-C7.  2011   08/01/2023 from [facility] note Kaitlyn Frazier has a history of HTN, asthma, OSA, hyperlipidemia, and chronic pain.    She  has a history of cervical disc arthoplasty C4-C5 and ACDF C6-C7 in 2011. She did well until fall in August of 2023.    Last seen by me on 06/21/23 for neck pain and headaches that started after dislocating her jaw with fall in August of 2023.    She has a residual bone spur on left at C6-C7 resulting in mild spinal stenosis and moderate left C7 foraminal stenosis. Also with right spur/disc at C5-C6 with moderate central stenosis and mild right C6 foraminal stenosis.    Dr. Clois thinks most of her pain is from C6-C7. He does not think her headaches are related to her neck pain.    She was referred to PMR for injections and is here for follow up. Referral to neurology recommended for headaches and she declined.    She had a left C7-T1 IL ESI by Dr. Merriam on 07/20/23.    She had improvement with her injection! She still has some constant neck pain with radiation into her left shoulder blade. No arm pain. Headaches are still present, but she is having less of them. No dizziness.    She has been able to wear night brace that jaw specialist gave her with no increased neck pain.    This pain started after fall at work in August of 2023. She was doing well after her cervical surgery and no significant pain in her neck prior to the fall.    She is doing PT at [facility] and needs  new orders. She has good relief with dry needling and cupping.    PCP gave her ultram  which helped a lot.   She vapes with nicotine 2-3 times per day.    Conservative measures:  Physical therapy: had osteopathic manipulative medicine done by PCP, she is doing PT at [facility] without relief.  Multimodal medical therapy including regular antiinflammatories: ultram , lyrcia, cymbalta , diclofenac, prednisone  Injections:  denies epidural steroid injections   Past Surgery:  History of lumbar fusion L5-S1 2010 History of cervical disc arthoplasty C4-C5 and ACDF C6-C7.  2011   Kaitlyn Frazier has no symptoms of cervical  myelopathy.   The symptoms are causing a significant impact on the patient's life.   Review of Systems:  A 10 point review of systems is negative, except for the pertinent positives and negatives detailed in the HPI.  Past Medical History: Past Medical History:  Diagnosis Date   Abnormal vaginal Pap smear    Depression    Fibromyalgia    hx of Conversion disorder    hx of Peritonitis (HCC)    Mitral valve regurgitation    Sleep apnea    Thyroid  nodule     Past Surgical History: Past Surgical History:  Procedure Laterality Date   ABDOMINAL HYSTERECTOMY     ANTERIOR / POSTERIOR COMBINED FUSION CERVICAL SPINE     ANTERIOR LAT LUMBAR FUSION     BIOPSY  07/26/2023   Procedure: BIOPSY;  Surgeon: Unk Corinn Skiff, MD;  Location: [facility] ENDOSCOPY;  Service: Gastroenterology;;   BREAST BIOPSY N/A    pt unsure which breast 10 years ago-fibrous tissue   CESAREAN SECTION     COLONOSCOPY WITH PROPOFOL  N/A 07/26/2023   Procedure: COLONOSCOPY WITH PROPOFOL ;  Surgeon: Unk Corinn Skiff, MD;  Location: [facility] ENDOSCOPY;  Service: Gastroenterology;  Laterality: N/A;   ESOPHAGOGASTRODUODENOSCOPY  07/26/2023   Procedure: ESOPHAGOGASTRODUODENOSCOPY (EGD);  Surgeon: Unk Corinn Skiff, MD;  Location: [facility] ENDOSCOPY;  Service: Gastroenterology;;   INGUINAL HERNIA REPAIR     NOSE SURGERY     TUMOR REMOVAL     on her SI joint    Allergies: Allergies as of 12/13/2023 - Review Complete 08/16/2023  Allergen Reaction Noted   Gabapentin Other (See Comments), Hives, and Rash 04/16/2022   Amitriptyline Other (See Comments) 04/16/2022   Baclofen Other (See Comments) 04/16/2022   Cyclobenzaprine  Other (See Comments) 04/16/2022   Doxepin   11/24/2022   Latex Hives 04/16/2022   Norflex tablets [orphenadrine]  05/24/2022   Pollen extract Itching 04/16/2022   Tizanidine Other (See Comments) 08/04/2022   Other Rash 04/16/2022    Medications:  Current Outpatient Medications:    albuterol  (VENTOLIN   HFA) 108 (90 Base) MCG/ACT inhaler, Inhale 1-2 puffs into the lungs every 6 (six) hours as needed., Disp: 18 g, Rfl: 0   buPROPion  (WELLBUTRIN  SR) 150 MG 12 hr tablet, Take 1 tablet (150 mg total) by mouth 2 (two) times daily., Disp: 60 tablet, Rfl: 0   Cholecalciferol (VITAMIN D -3) 25 MCG (1000 UT) CAPS, Take by mouth., Disp: , Rfl:    DULoxetine  (CYMBALTA ) 60 MG capsule, Take 1 capsule (60 mg total) by mouth daily., Disp: 30 capsule, Rfl: 0   hydrochlorothiazide  (HYDRODIURIL ) 25 MG tablet, Take 1 tablet (25 mg total) by mouth daily., Disp: 30 tablet, Rfl: 0   hydrOXYzine  (ATARAX ) 50 MG tablet, TAKE 1/2 TO 1 TABLET(25 TO 50 MG) BY MOUTH AT BEDTIME, Disp: 30 tablet, Rfl: 0   lidocaine  (LIDODERM ) 5 %, Place 1 patch onto the skin  every 12 (twelve) hours. Remove after 12 hours and then you can put on a new patch 12 hours later (12 hours on and then 12 hours off)., Disp: 30 patch, Rfl: 0   meloxicam  (MOBIC ) 15 MG tablet, Take 1 tablet (15 mg total) by mouth daily., Disp: 30 tablet, Rfl: 0   metroNIDAZOLE  (METROGEL ) 1 % gel, Apply topically daily., Disp: 45 g, Rfl: 3   omeprazole  (PRILOSEC) 40 MG capsule, Take 1 capsule (40 mg total) by mouth daily., Disp: 90 capsule, Rfl: 3   pregabalin  (LYRICA ) 150 MG capsule, Take 1 capsule (150 mg total) by mouth 3 (three) times daily., Disp: 90 capsule, Rfl: 0  Social History: Social History   Tobacco Use   Smoking status: Never    Passive exposure: Never   Smokeless tobacco: Never   Tobacco comments:    Sometimes vaping  Vaping Use   Vaping status: Some Days   Substances: Nicotine  Substance Use Topics   Alcohol use: Never   Drug use: Never    Family Medical History: Family History  Problem Relation Age of Onset   Alcohol abuse Mother    Bipolar disorder Mother    Uterine cancer Paternal Aunt        20s   Ovarian cancer Paternal Aunt        81s   Ovarian cancer Paternal Aunt        49s   Ovarian cancer Cousin        61s   Tourette syndrome  Son     Physical Examination: There were no vitals filed for this visit.   General: Patient is in no apparent distress. Attention to examination is appropriate.  Neck:   Supple.    Respiratory: Patient is breathing without any difficulty.   NEUROLOGICAL:     Awake, alert, oriented to person, place, and time.  Speech is clear and fluent.   Cranial Nerves: Pupils equal round and reactive to light.  Facial tone is symmetric.  Facial sensation is symmetric. Shoulder shrug is symmetric. Tongue protrusion is midline.  There is no pronator drift.  Strength: Side Biceps Triceps Deltoid Interossei Grip Wrist Ext. Wrist Flex.  R 5 5 5 5 5 5 5   L 5 5 5 5 5 5 5    Side Iliopsoas Quads Hamstring PF DF EHL  R 5 5 5 5 5 5   L 5 5 5 5 5 5    Reflexes are 1+ and symmetric at the biceps, triceps, brachioradialis, patella and achilles.   Hoffman's is absent.   Bilateral upper and lower extremity sensation is intact to light touch.    No evidence of dysmetria noted.  Gait is normal.    +pain with ROM of L shoulder  Medical Decision Making  Imaging: MRI C spine 05/25/2023 IMPRESSION: 1. C4-C5 disc arthroplasty with moderate susceptibility artifact limiting detail at that level. Probably no spinal or high-grade foraminal stenosis.   2. C6-C7 ACDF with evidence of solid interbody arthrodesis, but residual leftward osteophytosis resulting in mild spinal stenosis and moderate left C7 foraminal stenosis. Mild left hemi cord mass effect.   3. 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 Up to moderate spinal stenosis and right hemi cord mass effect, but no cord signal abnormality. Only mild right C6 foraminal stenosis.   4. Mild anterolisthesis at C7-T1 with moderate to severe facet degeneration and moderate left C8 neural foraminal stenosis.     Electronically Signed   By: VEAR  Shona M.D.   On: 05/25/2023 06:55  MRI L spine 05/13/2023 MPRESSION: 1.  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 Correlation with radiographs is recommended prior to any operative intervention.   2. L5-S1 decompression and fusion with solid arthrodesis by CT last year and no adverse features.   3. L4-L5 disc arthroplasty with considerable hardware artifact on both CT and MRI. Probably no significant spinal stenosis. And L4 foramina appear patent without stenosis. But ongoing moderate to severe facet arthropathy at that level. Facet disease can be a source of low back pain which sometimes radiates.   4. Adjacent segment disease at L3-L4 with circumferential disc bulge and moderate facet arthropathy. Mild spinal stenosis and mild bilateral L3 neural foraminal stenosis.     Electronically Signed   By: VEAR Shona M.D.   On: 05/25/2023 06:47    I have personally reviewed the images and agree with the above interpretation.  Assessment and Plan: Kaitlyn Frazier is a pleasant 63 y.o. female with left-sided C7 and C8 radiculopathies that are somewhat improved after an injection.  She is now having slightly different pain in her left shoulder.  I would like to set her up for left shoulder injection.  I would like to send her for nerve conduction study to evaluate her hand symptoms.  She may have peripheral neuropathy or symptoms of carpal tunnel syndrome.  I will refer her to our laryngologist for evaluation for her throat.  I have asked her to let me know when her left shoulder injection is scheduled and I will have a telephone visit with her approximately 1 week later.    I spent a total of 15 minutes in this patient's care today. This time was spent reviewing pertinent records including imaging studies, obtaining and confirming history, performing a directed evaluation, formulating and discussing my recommendations, and documenting the visit within the medical record.   Thank you for  involving me in the care of this patient.      Westlyn Glaza K. Clois MD, [facility]

## 2023-12-14 ENCOUNTER — Encounter (INDEPENDENT_AMBULATORY_CARE_PROVIDER_SITE_OTHER): Payer: Self-pay | Admitting: Otolaryngology

## 2023-12-22 ENCOUNTER — Encounter: Payer: Self-pay | Admitting: Family Medicine

## 2023-12-22 ENCOUNTER — Ambulatory Visit: Payer: Medicare Other | Admitting: Family Medicine

## 2023-12-22 VITALS — BP 160/90 | HR 79 | Temp 98.3°F | Wt 195.6 lb

## 2023-12-22 DIAGNOSIS — Z23 Encounter for immunization: Secondary | ICD-10-CM | POA: Diagnosis not present

## 2023-12-22 DIAGNOSIS — G894 Chronic pain syndrome: Secondary | ICD-10-CM

## 2023-12-22 DIAGNOSIS — G8929 Other chronic pain: Secondary | ICD-10-CM

## 2023-12-22 DIAGNOSIS — F4310 Post-traumatic stress disorder, unspecified: Secondary | ICD-10-CM

## 2023-12-22 DIAGNOSIS — Z0000 Encounter for general adult medical examination without abnormal findings: Secondary | ICD-10-CM

## 2023-12-22 DIAGNOSIS — G4733 Obstructive sleep apnea (adult) (pediatric): Secondary | ICD-10-CM

## 2023-12-22 DIAGNOSIS — I10 Essential (primary) hypertension: Secondary | ICD-10-CM | POA: Diagnosis not present

## 2023-12-22 DIAGNOSIS — Z1231 Encounter for screening mammogram for malignant neoplasm of breast: Secondary | ICD-10-CM | POA: Diagnosis not present

## 2023-12-22 DIAGNOSIS — J309 Allergic rhinitis, unspecified: Secondary | ICD-10-CM

## 2023-12-22 DIAGNOSIS — R7301 Impaired fasting glucose: Secondary | ICD-10-CM

## 2023-12-22 DIAGNOSIS — K219 Gastro-esophageal reflux disease without esophagitis: Secondary | ICD-10-CM

## 2023-12-22 DIAGNOSIS — E782 Mixed hyperlipidemia: Secondary | ICD-10-CM

## 2023-12-22 DIAGNOSIS — Z113 Encounter for screening for infections with a predominantly sexual mode of transmission: Secondary | ICD-10-CM

## 2023-12-22 DIAGNOSIS — E559 Vitamin D deficiency, unspecified: Secondary | ICD-10-CM

## 2023-12-22 DIAGNOSIS — F331 Major depressive disorder, recurrent, moderate: Secondary | ICD-10-CM

## 2023-12-22 DIAGNOSIS — Z136 Encounter for screening for cardiovascular disorders: Secondary | ICD-10-CM | POA: Diagnosis not present

## 2023-12-22 DIAGNOSIS — G4700 Insomnia, unspecified: Secondary | ICD-10-CM

## 2023-12-22 LAB — MICROALBUMIN, URINE WAIVED
Creatinine, Urine Waived: 100 mg/dL (ref 10–300)
Microalb, Ur Waived: 30 mg/L — ABNORMAL HIGH (ref 0–19)

## 2023-12-22 LAB — BAYER DCA HB A1C WAIVED: HB A1C (BAYER DCA - WAIVED): 6.3 % — ABNORMAL HIGH (ref 4.8–5.6)

## 2023-12-22 MED ORDER — HYDROXYZINE HCL 50 MG PO TABS: 50.0000 mg | ORAL_TABLET | Freq: Every evening | ORAL | 1 refills | Status: AC | PRN

## 2023-12-22 MED ORDER — MELOXICAM 15 MG PO TABS: 15.0000 mg | ORAL_TABLET | Freq: Every day | ORAL | 1 refills | Status: AC

## 2023-12-22 MED ORDER — DULOXETINE HCL 60 MG PO CPEP: 60.0000 mg | ORAL_CAPSULE | Freq: Every day | ORAL | 1 refills | Status: AC

## 2023-12-22 MED ORDER — CLONAZEPAM 0.5 MG PO TABS: 0.5000 mg | ORAL_TABLET | Freq: Two times a day (BID) | ORAL | 0 refills | Status: AC | PRN

## 2023-12-22 MED ORDER — TRAZODONE HCL 50 MG PO TABS: 25.0000 mg | ORAL_TABLET | Freq: Every evening | ORAL | 0 refills | Status: AC | PRN

## 2023-12-22 MED ORDER — BUPROPION HCL ER (SR) 150 MG PO TB12: 150.0000 mg | ORAL_TABLET | Freq: Two times a day (BID) | ORAL | 1 refills | Status: AC

## 2023-12-22 MED ORDER — AMLODIPINE BESYLATE 2.5 MG PO TABS: 2.5000 mg | ORAL_TABLET | Freq: Every day | ORAL | 1 refills | Status: AC

## 2023-12-22 MED ORDER — OMEPRAZOLE 40 MG PO CPDR: 40.0000 mg | DELAYED_RELEASE_CAPSULE | Freq: Every day | ORAL | 1 refills | Status: AC

## 2023-12-22 MED ORDER — ALBUTEROL SULFATE HFA 108 (90 BASE) MCG/ACT IN AERS: 1.0000 | INHALATION_SPRAY | Freq: Four times a day (QID) | RESPIRATORY_TRACT | 6 refills | Status: AC | PRN

## 2023-12-22 MED ORDER — HYDROCHLOROTHIAZIDE 25 MG PO TABS: 25.0000 mg | ORAL_TABLET | Freq: Every day | ORAL | 1 refills | Status: AC

## 2023-12-22 MED ORDER — CYCLOBENZAPRINE HCL 10 MG PO TABS: 10.0000 mg | ORAL_TABLET | Freq: Every day | ORAL | 0 refills | Status: AC

## 2023-12-22 NOTE — Progress Notes (Signed)
BP (!) 160/90   Pulse 79   Temp 98.3 F (36.8 C) (Oral)   Wt 195 lb 9.6 oz (88.7 kg)   SpO2 97%   BMI 34.65 kg/m    Subjective:    Patient ID: Kaitlyn Frazier, female    DOB: February 19, 1961, 63 y.o.   MRN: [id number]  HPI: Kaitlyn Frazier is a 63 y.o. female presenting on 12/22/2023 for comprehensive medical examination. Current medical complaints include:  She is going through a divorce. She is going to be moving this week. She is moving back to [place]   HYPERTENSION  Hypertension status: uncontrolled  Satisfied with current treatment? yes Duration of hypertension: chronic BP monitoring frequency:  not checking BP medication side effects:  no Medication compliance: good compliance Previous BP meds:lisinopril Aspirin: no Recurrent headaches: no Visual changes: no Palpitations: no Dyspnea: no Chest pain: no Lower extremity edema: no Dizzy/lightheaded: no  ANXIETY/DEPRESSION Duration: Chronic Status:exacerbated Anxious mood: yes  Excessive worrying: yes Irritability: no  Sweating: no Nausea: yes Palpitations:yes Hyperventilation: yes Panic attacks: no Agoraphobia: no  Obscessions/compulsions: no Depressed mood: yes    12/22/2023    9:38 AM 07/04/2023    8:29 AM 06/16/2023    2:13 PM 05/31/2023   10:58 AM 04/28/2023   11:08 AM  Depression screen PHQ 2/9  Decreased Interest 1 3 1 1 1   Down, Depressed, Hopeless 1 2 1 1 1   PHQ - 2 Score 2 5 2 2 2   Altered sleeping 3 3 3 3 3   Tired, decreased energy 3 3 2 3 3   Change in appetite 1 3 1 2 1   Feeling bad or failure about yourself  1 2 2 1 1   Trouble concentrating 2 2 3  0 0  Moving slowly or fidgety/restless 1 0 0 0 0  Suicidal thoughts 0 1 0 0 0  PHQ-9 Score 13 19 13 11 10   Difficult doing work/chores  Extremely dIfficult Very difficult Somewhat difficult Very difficult   Anhedonia: no Weight changes: no Insomnia: yes hard to fall asleep  Hypersomnia: yes Fatigue/loss of energy: yes Feelings of  worthlessness: yes Feelings of guilt: yes Impaired concentration/indecisiveness: yes Suicidal ideations: no  Crying spells: yes Recent Stressors/Life Changes: yes   Relationship problems: yes   Family stress: yes     Financial stress: yes    Job stress: no    Recent death/loss: yes  SLEEP APNEA Sleep apnea status: uncontrolled Duration: chronic Satisfied with current treatment?:  no- never got her CPAP CPAP use:  no Sleep quality with CPAP use: N/A Last sleep study: 11/21/22 Wakes feeling refreshed:  no Daytime hypersomnolence:  yes Fatigue:  yes Insomnia:  yes Good sleep hygiene:  yes Difficulty falling asleep:  yes Difficulty staying asleep:  yes Snoring bothers bed partner:  yes Observed apnea by bed partner: yes Obesity:  yes Hypertension: yes  Pulmonary hypertension:  no Coronary artery disease:  no   She currently lives with: husband (about to leave) Menopausal Symptoms: no  Depression Screen done today and results listed below:     12/22/2023    9:38 AM 07/04/2023    8:29 AM 06/16/2023    2:13 PM 05/31/2023   10:58 AM 04/28/2023   11:08 AM  Depression screen PHQ 2/9  Decreased Interest 1 3 1 1 1   Down, Depressed, Hopeless 1 2 1 1 1   PHQ - 2 Score 2 5 2 2 2   Altered sleeping 3 3 3 3 3   Tired, decreased energy 3  3 2 3 3   Change in appetite 1 3 1 2 1   Feeling bad or failure about yourself  1 2 2 1 1   Trouble concentrating 2 2 3  0 0  Moving slowly or fidgety/restless 1 0 0 0 0  Suicidal thoughts 0 1 0 0 0  PHQ-9 Score 13 19 13 11 10   Difficult doing work/chores  Extremely dIfficult Very difficult Somewhat difficult Very difficult    Past Medical History:  Past Medical History:  Diagnosis Date   Abnormal vaginal Pap smear    Depression    Fibromyalgia    hx of Conversion disorder    hx of Peritonitis (HCC)    Mitral valve regurgitation    Sleep apnea    Thyroid nodule     Surgical History:  Past Surgical History:  Procedure Laterality Date    ABDOMINAL HYSTERECTOMY     ANTERIOR / POSTERIOR COMBINED FUSION CERVICAL SPINE     ANTERIOR LAT LUMBAR FUSION     BIOPSY  07/26/2023   Procedure: BIOPSY;  Surgeon: Toney Reil, MD;  Location: [facility] ENDOSCOPY;  Service: Gastroenterology;;   BREAST BIOPSY N/A    pt unsure which breast 10 years ago-fibrous tissue   CESAREAN SECTION     COLONOSCOPY WITH PROPOFOL N/A 07/26/2023   Procedure: COLONOSCOPY WITH PROPOFOL;  Surgeon: Toney Reil, MD;  Location: [facility] ENDOSCOPY;  Service: Gastroenterology;  Laterality: N/A;   ESOPHAGOGASTRODUODENOSCOPY  07/26/2023   Procedure: ESOPHAGOGASTRODUODENOSCOPY (EGD);  Surgeon: Toney Reil, MD;  Location: [facility] ENDOSCOPY;  Service: Gastroenterology;;   INGUINAL HERNIA REPAIR     NOSE SURGERY     TUMOR REMOVAL     on her SI joint    Medications:  Current Outpatient Medications on File Prior to Visit  Medication Sig   Cholecalciferol (VITAMIN D-3) 25 MCG (1000 UT) CAPS Take by mouth.   metroNIDAZOLE (METROGEL) 1 % gel Apply topically daily.   pregabalin (LYRICA) 150 MG capsule Take 1 capsule (150 mg total) by mouth 3 (three) times daily.   No current facility-administered medications on file prior to visit.    Allergies:  Allergies  Allergen Reactions   Gabapentin Other (See Comments), Hives and Rash    Severe depression per pt Other reaction(s): confusion, Other Severe depression per pt    Amitriptyline Other (See Comments)    hallucinations   Baclofen Other (See Comments)    Severe depression per pt   Cyclobenzaprine Other (See Comments)    headache   Doxepin    Latex Hives   Norflex Tablets [Orphenadrine]    Pollen Extract Itching   Tizanidine Other (See Comments)   Other Rash    Social History:  Social History   Socioeconomic History   Marital status: Married    Spouse name: Justin Mend   Number of children: 1   Years of education: Not on file   Highest education level: Master's degree (e.g., MA, MS, MEng, MEd,  MSW, MBA)  Occupational History   Not on file  Tobacco Use   Smoking status: Some Days    Types: Cigarettes    Passive exposure: Never   Smokeless tobacco: Never   Tobacco comments:    Sometimes vaping  Vaping Use   Vaping status: Some Days   Substances: Nicotine  Substance and Sexual Activity   Alcohol use: Never   Drug use: Never   Sexual activity: Yes  Other Topics Concern   Not on file  Social History Narrative   Not on  file   Social Drivers of Health   Financial Resource Strain: Low Risk  (04/11/2023)   Overall Financial Resource Strain (CARDIA)    Difficulty of Paying Living Expenses: Not hard at all  Food Insecurity: No Food Insecurity (04/11/2023)   Hunger Vital Sign    Worried About Running Out of Food in the Last Year: Never true    Ran Out of Food in the Last Year: Never true  Transportation Needs: No Transportation Needs (04/11/2023)   PRAPARE - Administrator, Civil Service (Medical): No    Lack of Transportation (Non-Medical): No  Physical Activity: Insufficiently Active (04/11/2023)   Exercise Vital Sign    Days of Exercise per Week: 3 days    Minutes of Exercise per Session: 30 min  Stress: No Stress Concern Present (04/11/2023)   [employer] of Occupational Health - Occupational Stress Questionnaire    Feeling of Stress : Only a little  Social Connections: Moderately Isolated (04/11/2023)   Social Connection and Isolation Panel [NHANES]    Frequency of Communication with Friends and Family: Once a week    Frequency of Social Gatherings with Friends and Family: Never    Attends Religious Services: Never    Database administrator or Organizations: Yes    Attends Engineer, structural: More than 4 times per year    Marital Status: Married  Catering manager Violence: Not At Risk (04/11/2023)   Humiliation, Afraid, Rape, and Kick questionnaire    Fear of Current or Ex-Partner: No    Emotionally Abused: No    Physically Abused: No     Sexually Abused: No   Social History   Tobacco Use  Smoking Status Some Days   Types: Cigarettes   Passive exposure: Never  Smokeless Tobacco Never  Tobacco Comments   Sometimes vaping   Social History   Substance and Sexual Activity  Alcohol Use Never    Family History:  Family History  Problem Relation Age of Onset   Alcohol abuse Mother    Bipolar disorder Mother    Uterine cancer Paternal Aunt        55s   Ovarian cancer Paternal Aunt        8s   Ovarian cancer Paternal Aunt        34s   Ovarian cancer Cousin        25s   Tourette syndrome Son     Past medical history, surgical history, medications, allergies, family history and social history reviewed with patient today and changes made to appropriate areas of the chart.   Review of Systems  Constitutional:  Positive for malaise/fatigue. Negative for chills, diaphoresis, fever and weight loss.  HENT: Negative.    Eyes: Negative.   Respiratory:  Positive for cough. Negative for hemoptysis, sputum production, shortness of breath and wheezing.   Cardiovascular:  Positive for palpitations. Negative for chest pain, orthopnea, claudication, leg swelling and PND.  Gastrointestinal:  Positive for diarrhea and heartburn. Negative for abdominal pain, blood in stool, constipation, melena, nausea and vomiting.  Genitourinary: Negative.   Musculoskeletal:  Positive for back pain, joint pain, myalgias and neck pain. Negative for falls.  Skin:  Positive for rash. Negative for itching.       Skin tags  Neurological:  Positive for tingling and weakness. Negative for dizziness, tremors, sensory change, speech change, focal weakness, seizures, loss of consciousness and headaches.  Endo/Heme/Allergies:  Positive for environmental allergies and polydipsia. Does not bruise/bleed easily.  Psychiatric/Behavioral:  Positive for depression. Negative for hallucinations, memory loss, substance abuse and suicidal ideas. The patient is  nervous/anxious. The patient does not have insomnia.    All other ROS negative except what is listed above and in the HPI.      Objective:    BP (!) 160/90   Pulse 79   Temp 98.3 F (36.8 C) (Oral)   Wt 195 lb 9.6 oz (88.7 kg)   SpO2 97%   BMI 34.65 kg/m   Wt Readings from Last 3 Encounters:  12/22/23 195 lb 9.6 oz (88.7 kg)  12/13/23 186 lb (84.4 kg)  08/16/23 186 lb 6.4 oz (84.6 kg)    Physical Exam Vitals and nursing note reviewed.  Constitutional:      General: She is not in acute distress.    Appearance: Normal appearance. She is obese. She is not ill-appearing, toxic-appearing or diaphoretic.  HENT:     Head: Normocephalic and atraumatic.     Right Ear: Tympanic membrane, ear canal and external ear normal. There is no impacted cerumen.     Left Ear: Tympanic membrane, ear canal and external ear normal. There is no impacted cerumen.     Nose: Nose normal. No congestion or rhinorrhea.     Mouth/Throat:     Mouth: Mucous membranes are moist.     Pharynx: Oropharynx is clear. No oropharyngeal exudate or posterior oropharyngeal erythema.  Eyes:     General: No scleral icterus.       Right eye: No discharge.        Left eye: No discharge.     Extraocular Movements: Extraocular movements intact.     Conjunctiva/sclera: Conjunctivae normal.     Pupils: Pupils are equal, round, and reactive to light.  Neck:     Vascular: No carotid bruit.  Cardiovascular:     Rate and Rhythm: Normal rate and regular rhythm.     Pulses: Normal pulses.     Heart sounds: No murmur heard.    No friction rub. No gallop.  Pulmonary:     Effort: Pulmonary effort is normal. No respiratory distress.     Breath sounds: Normal breath sounds. No stridor. No wheezing, rhonchi or rales.  Chest:     Chest wall: No tenderness.  Abdominal:     General: Abdomen is flat. Bowel sounds are normal. There is no distension.     Palpations: Abdomen is soft. There is no mass.     Tenderness: There is no  abdominal tenderness. There is no right CVA tenderness, left CVA tenderness, guarding or rebound.     Hernia: No hernia is present.  Genitourinary:    Comments: Breast and pelvic exams deferred with shared decision making Musculoskeletal:        General: No swelling, tenderness, deformity or signs of injury.     Cervical back: Normal range of motion and neck supple. No rigidity. No muscular tenderness.     Right lower leg: No edema.     Left lower leg: No edema.  Lymphadenopathy:     Cervical: No cervical adenopathy.  Skin:    General: Skin is warm and dry.     Capillary Refill: Capillary refill takes less than 2 seconds.     Coloration: Skin is not jaundiced or pale.     Findings: No bruising, erythema, lesion or rash.  Neurological:     General: No focal deficit present.     Mental Status: She is alert and oriented to person,  place, and time. Mental status is at baseline.     Cranial Nerves: No cranial nerve deficit.     Sensory: No sensory deficit.     Motor: No weakness.     Coordination: Coordination normal.     Gait: Gait normal.     Deep Tendon Reflexes: Reflexes normal.  Psychiatric:        Mood and Affect: Mood normal.        Behavior: Behavior normal.        Thought Content: Thought content normal.        Judgment: Judgment normal.     Results for orders placed or performed in visit on 12/22/23  GC/Chlamydia Probe Amp   Collection Time: 12/22/23 10:18 AM   Specimen: Urine   UR  Result Value Ref Range   Chlamydia trachomatis, NAA Negative Negative   Neisseria Gonorrhoeae by PCR Negative Negative  CBC with Differential/Platelet   Collection Time: 12/22/23 10:18 AM  Result Value Ref Range   WBC 8.8 3.4 - 10.8 x10E3/uL   RBC 4.05 3.77 - 5.28 x10E6/uL   Hemoglobin 11.6 11.1 - 15.9 g/dL   Hematocrit 28.4 13.2 - 46.6 %   MCV 87 79 - 97 fL   MCH 28.6 26.6 - 33.0 pg   MCHC 32.8 31.5 - 35.7 g/dL   RDW 44.0 10.2 - 72.5 %   Platelets 538 (H) 150 - 450 x10E3/uL    Neutrophils 63 Not Estab. %   Lymphs 29 Not Estab. %   Monocytes 6 Not Estab. %   Eos 1 Not Estab. %   Basos 1 Not Estab. %   Neutrophils Absolute 5.5 1.4 - 7.0 x10E3/uL   Lymphocytes Absolute 2.5 0.7 - 3.1 x10E3/uL   Monocytes Absolute 0.6 0.1 - 0.9 x10E3/uL   EOS (ABSOLUTE) 0.1 0.0 - 0.4 x10E3/uL   Basophils Absolute 0.1 0.0 - 0.2 x10E3/uL   Immature Granulocytes 0 Not Estab. %   Immature Grans (Abs) 0.0 0.0 - 0.1 x10E3/uL  Comprehensive metabolic panel   Collection Time: 12/22/23 10:18 AM  Result Value Ref Range   Glucose 95 70 - 99 mg/dL   BUN 14 8 - 27 mg/dL   Creatinine, Ser 3.66 0.57 - 1.00 mg/dL   eGFR 68 >44 IH/KVQ/2.59   BUN/Creatinine Ratio 15 12 - 28   Sodium 139 134 - 144 mmol/L   Potassium 4.5 3.5 - 5.2 mmol/L   Chloride 99 96 - 106 mmol/L   CO2 25 20 - 29 mmol/L   Calcium 9.4 8.7 - 10.3 mg/dL   Total Protein 6.6 6.0 - 8.5 g/dL   Albumin 4.3 3.9 - 4.9 g/dL   Globulin, Total 2.3 1.5 - 4.5 g/dL   Bilirubin Total 0.4 0.0 - 1.2 mg/dL   Alkaline Phosphatase 74 44 - 121 IU/L   AST 15 0 - 40 IU/L   ALT 20 0 - 32 IU/L  Lipid Panel w/o Chol/HDL Ratio   Collection Time: 12/22/23 10:18 AM  Result Value Ref Range   Cholesterol, Total 209 (H) 100 - 199 mg/dL   Triglycerides 563 0 - 149 mg/dL   HDL 85 >87 mg/dL   VLDL Cholesterol Cal 22 5 - 40 mg/dL   LDL Chol Calc (NIH) 564 (H) 0 - 99 mg/dL  TSH   Collection Time: 12/22/23 10:18 AM  Result Value Ref Range   TSH 0.875 0.450 - 4.500 uIU/mL  Microalbumin, Urine Waived   Collection Time: 12/22/23 10:18 AM  Result Value Ref Range  Microalb, Ur Waived 30 (H) 0 - 19 mg/L   Creatinine, Urine Waived 100 10 - 300 mg/dL   Microalb/Creat Ratio 30-300 (H) <30 mg/g  Bayer DCA Hb A1c Waived   Collection Time: 12/25/2023 10:18 AM  Result Value Ref Range   HB A1C (BAYER DCA - WAIVED) 6.3 (H) 4.8 - 5.6 %  HIV Antibody (routine testing w rflx)   Collection Time: 12-25-23 10:18 AM  Result Value Ref Range   HIV Screen 4th  Generation wRfx Non Reactive Non Reactive  VITAMIN D 25 Hydroxy (Vit-D Deficiency, Fractures)   Collection Time: 12-25-2023 10:18 AM  Result Value Ref Range   Vit D, 25-Hydroxy 22.0 (L) 30.0 - 100.0 ng/mL  Treponemal Antibodies, TPPA   Collection Time: Dec 25, 2023 10:19 AM  Result Value Ref Range   Treponemal Antibodies, TPPA Non Reactive Non Reactive   Interpretation: Comment   HSV 1 and 2 Ab, IgG   Collection Time: 12-25-2023 10:19 AM  Result Value Ref Range   HSV 1 Glycoprotein G Ab, IgG Non Reactive Non Reactive   HSV 2 IgG, Type Spec Reactive (A) Non Reactive  RPR w/reflex to TrepSure   Collection Time: 2023/12/25 10:19 AM  Result Value Ref Range   RPR Non Reactive Non Reactive  Acute Viral Hepatitis (HAV, HBV, HCV)   Collection Time: 12-25-2023 10:19 AM  Result Value Ref Range   Hep A IgM Negative Negative   Hepatitis B Surface Ag Negative Negative   Hep B C IgM Negative Negative   HCV Ab Non Reactive Non Reactive  Interpretation:   Collection Time: 25-Dec-2023 10:19 AM  Result Value Ref Range   HCV Interp 1: Comment       Assessment & Plan:   Problem List Items Addressed This Visit       Cardiovascular and Mediastinum   Benign essential hypertension   Not under good control. [place] out of [place] 2 days. Unable to follow up. Will start low dose amlodipine. Encouraged her to follow up with new PCP ASAP.       Relevant Medications   amLODipine (NORVASC) 2.5 MG tablet   hydrochlorothiazide (HYDRODIURIL) 25 MG tablet   Other Relevant Orders   CBC with Differential/Platelet (Completed)   Comprehensive metabolic panel (Completed)   TSH (Completed)   Microalbumin, Urine Waived (Completed)     Respiratory   Allergic rhinitis   Under good control on current regimen. Continue current regimen. Continue to monitor. Call with any concerns. Refills given.        Relevant Orders   Ambulatory referral to ENT   Obstructive sleep apnea syndrome   Never was able to get her CPAP-  will check on this and see if we can get it out to [place]. Call with any concerns.       Relevant Orders   Ambulatory referral to ENT     Digestive   Gastroesophageal reflux disease   Under good control on current regimen. Continue current regimen. Continue to monitor. Call with any concerns. Refills given. Labs drawn today.        Relevant Medications   omeprazole (PRILOSEC) 40 MG capsule     Endocrine   IFG (impaired fasting glucose)   Elevated with A1c of 6.3. Encouraged diet and exercise and establishing with new PCP in [place] ASAP.       Relevant Orders   CBC with Differential/Platelet (Completed)   Comprehensive metabolic panel (Completed)   Bayer DCA Hb A1c Waived (Completed)  Other   Mixed hyperlipidemia   Rechecking labs today. Await results. Treat as needed.       Relevant Medications   amLODipine (NORVASC) 2.5 MG tablet   hydrochlorothiazide (HYDRODIURIL) 25 MG tablet   Moderate recurrent major depression (HCC)   In exacerbation due to her upcoming divorce. Short course of lorazepam given to patient. Referral to psychiatry placed today for [place].       Relevant Medications   traZODone (DESYREL) 50 MG tablet   buPROPion (WELLBUTRIN SR) 150 MG 12 hr tablet   DULoxetine (CYMBALTA) 60 MG capsule   hydrOXYzine (ATARAX) 50 MG tablet   Other Relevant Orders   CBC with Differential/Platelet (Completed)   Comprehensive metabolic panel (Completed)   TSH (Completed)   Insomnia   In exacerbation due to her upcoming divorce. Short course of lorazepam given to patient. Referral to psychiatry placed today for [place]. Encouraged her to establish with PCP in [place] ASAP.       Relevant Medications   hydrOXYzine (ATARAX) 50 MG tablet   Other Relevant Orders   Ambulatory referral to Psychiatry   Vitamin D deficiency   Rechecking labs today. Await results. Treat as needed.       Relevant Orders   VITAMIN D 25 Hydroxy (Vit-D Deficiency, Fractures) (Completed)    PTSD (post-traumatic stress disorder)   In exacerbation due to her upcoming divorce. Short course of lorazepam given to patient. Referral to psychiatry placed today for [place].       Relevant Medications   traZODone (DESYREL) 50 MG tablet   buPROPion (WELLBUTRIN SR) 150 MG 12 hr tablet   DULoxetine (CYMBALTA) 60 MG capsule   hydrOXYzine (ATARAX) 50 MG tablet   Other Relevant Orders   Ambulatory referral to Psychiatry   Chronic pain syndrome   Referral to pain management in [place] placed today.       Relevant Medications   cyclobenzaprine (FLEXERIL) 10 MG tablet   traZODone (DESYREL) 50 MG tablet   clonazePAM (KLONOPIN) 0.5 MG tablet   buPROPion (WELLBUTRIN SR) 150 MG 12 hr tablet   DULoxetine (CYMBALTA) 60 MG capsule   meloxicam (MOBIC) 15 MG tablet   Other Relevant Orders   Ambulatory referral to Rheumatology   Ambulatory referral to Pain Clinic   Chronic thoracic back pain   Referral to pain management in [place] placed today.       Relevant Medications   cyclobenzaprine (FLEXERIL) 10 MG tablet   traZODone (DESYREL) 50 MG tablet   clonazePAM (KLONOPIN) 0.5 MG tablet   buPROPion (WELLBUTRIN SR) 150 MG 12 hr tablet   DULoxetine (CYMBALTA) 60 MG capsule   meloxicam (MOBIC) 15 MG tablet   Other Relevant Orders   Ambulatory referral to Rheumatology   Ambulatory referral to Pain Clinic   Other Visit Diagnoses       Routine general medical examination at a health care facility    -  Primary   Vaccines up dated. Screening labs checked today. Pap and colonoscopy up to date. Mammo ordered. Continue diet and exercise. Call with any concerns.     Encounter for screening mammogram for malignant neoplasm of breast       Mammo ordered today.   Relevant Orders   MM 3D SCREENING MAMMOGRAM BILATERAL BREAST     Screening for cardiovascular condition       Labs drawn today. Await results.   Relevant Orders   Lipid Panel w/o Chol/HDL Ratio (Completed)     Needs flu shot  Flu shot given today.   Relevant Orders   Flu vaccine trivalent PF, 6mos and older(Flulaval,Afluria,Fluarix,Fluzone) (Completed)     Need for pneumococcal 20-valent conjugate vaccination       Prevnar 20 given today.   Relevant Orders   Pneumococcal conjugate vaccine 20-valent (Prevnar 20) (Completed)     Screening examination for STI       Labs drawn today. Await results.   Relevant Orders   HSV 1 and 2 Ab, IgG (Completed)   HIV Antibody (routine testing w rflx) (Completed)   GC/Chlamydia Probe Amp (Completed)   RPR w/reflex to TrepSure (Completed)   Acute Viral Hepatitis (HAV, HBV, HCV) (Completed)        Follow up plan: No follow-ups on file.   LABORATORY TESTING:  - Pap smear: up to date  IMMUNIZATIONS:   - Tdap: Tetanus vaccination status reviewed: last tetanus booster within 10 years. - Influenza: Administered today - Prevnar: Administered today - COVID: Refused - HPV: Not applicable - Shingrix vaccine: Given elsewhere  SCREENING: -Mammogram: Ordered today  - Colonoscopy: Up to date   PATIENT COUNSELING:   Advised to take 1 mg of folate supplement per day if capable of pregnancy.   Sexuality: Discussed sexually transmitted diseases, partner selection, use of condoms, avoidance of unintended pregnancy  and contraceptive alternatives.   Advised to avoid cigarette smoking.  I discussed with the patient that most people either abstain from alcohol or drink within safe limits (<=14/week and <=4 drinks/occasion for males, <=7/weeks and <= 3 drinks/occasion for females) and that the risk for alcohol disorders and other health effects rises proportionally with the number of drinks per week and how often a drinker exceeds daily limits.  Discussed cessation/primary prevention of drug use and availability of treatment for abuse.   Diet: Encouraged to adjust caloric intake to maintain  or achieve ideal body weight, to reduce intake of dietary saturated fat and total fat, to  limit sodium intake by avoiding high sodium foods and not adding table salt, and to maintain adequate dietary potassium and calcium preferably from fresh fruits, vegetables, and low-fat dairy products.    stressed the importance of regular exercise  Injury prevention: Discussed safety belts, safety helmets, smoke detector, smoking near bedding or upholstery.   Dental health: Discussed importance of regular tooth brushing, flossing, and dental visits.    NEXT PREVENTATIVE PHYSICAL DUE IN 1 YEAR. No follow-ups on file.

## 2023-12-23 ENCOUNTER — Encounter: Payer: Self-pay | Admitting: Family Medicine

## 2023-12-23 ENCOUNTER — Telehealth: Payer: Self-pay

## 2023-12-23 LAB — COMPREHENSIVE METABOLIC PANEL
ALT: 20 [IU]/L (ref 0–32)
AST: 15 [IU]/L (ref 0–40)
Albumin: 4.3 g/dL (ref 3.9–4.9)
Alkaline Phosphatase: 74 [IU]/L (ref 44–121)
BUN/Creatinine Ratio: 15 (ref 12–28)
BUN: 14 mg/dL (ref 8–27)
Bilirubin Total: 0.4 mg/dL (ref 0.0–1.2)
CO2: 25 mmol/L (ref 20–29)
Calcium: 9.4 mg/dL (ref 8.7–10.3)
Chloride: 99 mmol/L (ref 96–106)
Creatinine, Ser: 0.95 mg/dL (ref 0.57–1.00)
Globulin, Total: 2.3 g/dL (ref 1.5–4.5)
Glucose: 95 mg/dL (ref 70–99)
Potassium: 4.5 mmol/L (ref 3.5–5.2)
Sodium: 139 mmol/L (ref 134–144)
Total Protein: 6.6 g/dL (ref 6.0–8.5)
eGFR: 68 mL/min/{1.73_m2} (ref >59)

## 2023-12-23 LAB — HSV 1 AND 2 AB, IGG
HSV 1 Glycoprotein G Ab, IgG: NONREACTIVE
HSV 2 IgG, Type Spec: REACTIVE — AB

## 2023-12-23 LAB — CBC WITH DIFFERENTIAL/PLATELET
Basophils Absolute: 0.1 10*3/uL (ref 0.0–0.2)
Basos: 1 %
EOS (ABSOLUTE): 0.1 10*3/uL (ref 0.0–0.4)
Eos: 1 %
Hematocrit: 35.4 % (ref 34.0–46.6)
Hemoglobin: 11.6 g/dL (ref 11.1–15.9)
Immature Grans (Abs): 0 10*3/uL (ref 0.0–0.1)
Immature Granulocytes: 0 %
Lymphocytes Absolute: 2.5 10*3/uL (ref 0.7–3.1)
Lymphs: 29 %
MCH: 28.6 pg (ref 26.6–33.0)
MCHC: 32.8 g/dL (ref 31.5–35.7)
MCV: 87 fL (ref 79–97)
Monocytes Absolute: 0.6 10*3/uL (ref 0.1–0.9)
Monocytes: 6 %
Neutrophils Absolute: 5.5 10*3/uL (ref 1.4–7.0)
Neutrophils: 63 %
Platelets: 538 10*3/uL — ABNORMAL HIGH (ref 150–450)
RBC: 4.05 x10E6/uL (ref 3.77–5.28)
RDW: 12.8 % (ref 11.7–15.4)
WBC: 8.8 10*3/uL (ref 3.4–10.8)

## 2023-12-23 LAB — LIPID PANEL W/O CHOL/HDL RATIO
Cholesterol, Total: 209 mg/dL — ABNORMAL HIGH (ref 100–199)
HDL: 85 mg/dL (ref >39)
LDL Chol Calc (NIH): 102 mg/dL — ABNORMAL HIGH (ref 0–99)
Triglycerides: 126 mg/dL (ref 0–149)
VLDL Cholesterol Cal: 22 mg/dL (ref 5–40)

## 2023-12-23 LAB — TREPONEMAL ANTIBODIES, TPPA: Treponemal Antibodies, TPPA: NONREACTIVE

## 2023-12-23 LAB — VITAMIN D 25 HYDROXY (VIT D DEFICIENCY, FRACTURES): Vit D, 25-Hydroxy: 22 ng/mL — ABNORMAL LOW (ref 30.0–100.0)

## 2023-12-23 LAB — HCV INTERPRETATION

## 2023-12-23 LAB — ACUTE VIRAL HEPATITIS (HAV, HBV, HCV)
HCV Ab: NONREACTIVE
Hep A IgM: NEGATIVE
Hep B C IgM: NEGATIVE
Hepatitis B Surface Ag: NEGATIVE

## 2023-12-23 LAB — RPR W/REFLEX TO TREPSURE: RPR: NONREACTIVE

## 2023-12-23 LAB — HIV ANTIBODY (ROUTINE TESTING W REFLEX): HIV Screen 4th Generation wRfx: NONREACTIVE

## 2023-12-23 LAB — TSH: TSH: 0.875 u[IU]/mL (ref 0.450–4.500)

## 2023-12-23 NOTE — Telephone Encounter (Signed)
-----   Message from Olevia Perches sent at 12/22/2023 10:59 AM EST ----- Order in for [facility] that needs to be sent to [facility] in [place], unsure if we need to fax one of their orders- can we check with them please

## 2023-12-23 NOTE — Telephone Encounter (Signed)
Spoke with [facility] Radiology department and was informed to fax over the Mammogram order to [phone] [facility]. Representative says to send over order along with insurance information.

## 2023-12-25 ENCOUNTER — Other Ambulatory Visit: Payer: Self-pay | Admitting: Family Medicine

## 2023-12-25 ENCOUNTER — Encounter: Payer: Self-pay | Admitting: Family Medicine

## 2023-12-25 LAB — GC/CHLAMYDIA PROBE AMP
Chlamydia trachomatis, NAA: NEGATIVE
Neisseria Gonorrhoeae by PCR: NEGATIVE

## 2023-12-25 MED ORDER — VALACYCLOVIR HCL 1 G PO TABS: 1000.0000 mg | ORAL_TABLET | Freq: Every day | ORAL | 1 refills | Status: AC

## 2023-12-25 MED ORDER — VITAMIN D (ERGOCALCIFEROL) 1.25 MG (50000 UNIT) PO CAPS: 50000.0000 [IU] | ORAL_CAPSULE | ORAL | 1 refills | Status: AC

## 2023-12-25 MED ORDER — PREGABALIN 150 MG PO CAPS: 150.0000 mg | ORAL_CAPSULE | Freq: Three times a day (TID) | ORAL | 0 refills | Status: AC

## 2023-12-25 NOTE — Assessment & Plan Note (Signed)
Referral to pain management in [place] placed today.

## 2023-12-25 NOTE — Assessment & Plan Note (Signed)
Under good control on current regimen. Continue current regimen. Continue to monitor. Call with any concerns. Refills given.   

## 2023-12-25 NOTE — Assessment & Plan Note (Addendum)
In exacerbation due to her upcoming divorce. Short course of clonazepam given to patient. Referral to psychiatry placed today for [place].  Encouraged her to establish with PCP in [place] ASAP.

## 2023-12-25 NOTE — Assessment & Plan Note (Signed)
Elevated with A1c of 6.3. Encouraged diet and exercise and establishing with new PCP in [place] ASAP.

## 2023-12-25 NOTE — Assessment & Plan Note (Addendum)
In exacerbation due to her upcoming divorce. Short course of clonazepam given to patient. Referral to psychiatry placed today for [place].

## 2023-12-25 NOTE — Assessment & Plan Note (Signed)
Never was able to get her CPAP- will check on this and see if we can get it out to [place]. Call with any concerns.

## 2023-12-25 NOTE — Assessment & Plan Note (Signed)
Rechecking labs today. Await results. Treat as needed.  °

## 2023-12-25 NOTE — Assessment & Plan Note (Signed)
Not under good control. Moving out of state in 2 days. Unable to follow up. Will start low dose amlodipine. Encouraged her to follow up with new PCP ASAP.

## 2023-12-25 NOTE — Assessment & Plan Note (Signed)
Under good control on current regimen. Continue current regimen. Continue to monitor. Call with any concerns. Refills given. Labs drawn today.   

## 2023-12-26 ENCOUNTER — Encounter: Payer: Self-pay | Admitting: Family Medicine

## 2023-12-28 ENCOUNTER — Telehealth: Payer: Self-pay

## 2023-12-28 NOTE — Telephone Encounter (Signed)
Spoke with patient to confirm her update address. Patient her current address is listed below. Patient says she would need to be the CPAP machine with the nasal, as she can't sleep with the mask as she takes it off in her sleep. Patient was also made aware that her Mammogram order has been [facility]ed [facility] [place] and they will be reaching out to her to get her scheduled. Patient verbalized understanding and has no further questions at this time.   [address] [place] [address]

## 2023-12-28 NOTE — Telephone Encounter (Signed)
-----   Message from Olevia Perches sent at 12/25/2023  9:49 PM EST ----- Never got her CPAP machine ordered in May- can we please check on this and see if we can order one for her in [place]?

## 2023-12-29 ENCOUNTER — Ambulatory Visit: Payer: Medicare Other | Admitting: Neurosurgery

## 2023-12-29 NOTE — Telephone Encounter (Signed)
CPAP machine order has been placed in [facility] for the patient.

## 2023-12-30 ENCOUNTER — Encounter: Payer: Medicare Other | Admitting: Neurology

## 2024-01-04 ENCOUNTER — Telehealth: Payer: Self-pay

## 2024-01-04 NOTE — Telephone Encounter (Signed)
[place]- we got a fax back that this psychiatrist does not treat these issues- can we please redirect the referral?

## 2024-01-04 NOTE — Telephone Encounter (Signed)
Received a fax that the patient was referred to the wrong ENT location. Per provider requested a telephone encounter be sent to have the location corrected. Please advise?

## 2024-01-05 ENCOUNTER — Encounter: Payer: Self-pay | Admitting: Family Medicine

## 2024-01-06 NOTE — Telephone Encounter (Signed)
Insurance will not pay for another sleep study for 3 years. I've never heard of an insurance wanting an annual sleep study- can we find out what's going on?

## 2024-01-11 ENCOUNTER — Other Ambulatory Visit: Payer: Self-pay | Admitting: Physician Assistant

## 2024-01-11 DIAGNOSIS — L719 Rosacea, unspecified: Secondary | ICD-10-CM

## 2024-01-12 NOTE — Telephone Encounter (Signed)
 Requested medication (s) are due for refill today: Yes  Requested medication (s) are on the active medication list: Yes  Last refill:  06/16/23  Future visit scheduled: No  Notes to clinic:  Unable to refill due to no refill protocol for this medication.      Requested Prescriptions  Pending Prescriptions Disp Refills   metroNIDAZOLE  (METROGEL ) 1 % gel [Pharmacy Med Name: METRONIDAZOLE  1% TOPICAL GEL TUBE] 60 g     Sig: APPLY TOPICALLY EVERY DAY     Off-Protocol Failed - 01/12/2024 12:38 PM      Failed - Medication not assigned to a protocol, review manually.      Passed - Valid encounter within last 12 months    Recent Outpatient Visits           3 weeks ago Routine general medical examination at a health care facility   [facility], [place] P, DO   6 months ago Osteoarthritis of spine with radiculopathy, cervical region   [facility], Megan P, DO   7 months ago Osteoarthritis of spine with radiculopathy, cervical region   [facility] Mecum, Rocky BRAVO, PA-C   7 months ago Osteoarthritis of spine with radiculopathy, cervical region   [facility] [place], Megan P, DO   8 months ago Insomnia, unspecified type   [facility] [place], [place], DO

## 2024-02-22 ENCOUNTER — Other Ambulatory Visit: Payer: Self-pay | Admitting: Family Medicine

## 2024-02-22 DIAGNOSIS — L719 Rosacea, unspecified: Secondary | ICD-10-CM

## 2024-02-23 NOTE — Telephone Encounter (Signed)
 Requested medication (s) are due for refill today - unsure  Requested medication (s) are on the active medication list -yes  Future visit scheduled -yes  Last refill: 01/13/24 60g  Notes to clinic: off protocol- provider review   Requested Prescriptions  Pending Prescriptions Disp Refills   metroNIDAZOLE (METROGEL) 1 % gel [Pharmacy Med Name: METRONIDAZOLE 1% TOPICAL  GEL TUBE] 60 g 0    Sig: APPLY EXTERNALLY TO THE AFFECTED AREA DAILY.     Off-Protocol Failed - 02/23/2024  8:18 AM      Failed - Medication not assigned to a protocol, review manually.      Passed - Valid encounter within last 12 months    Recent Outpatient Visits           2 months ago Routine general medical examination at a health care facility   [facility], [place] [facility], DO   7 months ago Osteoarthritis of spine with radiculopathy, cervical region   [facility], Megan P, DO   8 months ago Osteoarthritis of spine with radiculopathy, cervical region   [facility], Oswaldo Conroy, PA-C   8 months ago Osteoarthritis of spine with radiculopathy, cervical region   [facility], Megan P, DO   10 months ago Insomnia, unspecified type   [facility] [place], [place] P, DO                 Requested Prescriptions  Pending Prescriptions Disp Refills   metroNIDAZOLE (METROGEL) 1 % gel [Pharmacy Med Name: METRONIDAZOLE 1% TOPICAL  GEL TUBE] 60 g 0    Sig: APPLY EXTERNALLY TO THE AFFECTED AREA DAILY.     Off-Protocol Failed - 02/23/2024  8:18 AM      Failed - Medication not assigned to a protocol, review manually.      Passed - Valid encounter within last 12 months    Recent Outpatient Visits           2 months ago Routine general medical examination at a health care facility   [facility], [place] P, DO   7 months ago Osteoarthritis of spine  with radiculopathy, cervical region   [facility], Megan P, DO   8 months ago Osteoarthritis of spine with radiculopathy, cervical region   [facility] Mecum, Oswaldo Conroy, PA-C   8 months ago Osteoarthritis of spine with radiculopathy, cervical region   [facility] [place], Megan P, DO   10 months ago Insomnia, unspecified type   [facility] [place], [place], DO

## 2024-02-28 ENCOUNTER — Other Ambulatory Visit: Payer: Self-pay | Admitting: Family Medicine

## 2024-02-28 NOTE — Telephone Encounter (Signed)
 Requested medication (s) are due for refill today - yes  Requested medication (s) are on the active medication list -yes  Future visit scheduled -no  Last refill: 01/08/24 #90  Notes to clinic: non delegated Rx  Requested Prescriptions  Pending Prescriptions Disp Refills   pregabalin (LYRICA) 150 MG capsule [Pharmacy Med Name: PREGABALIN 150MG  CAPSULES] 90 capsule     Sig: TAKE 1 CAPSULE(150 MG) BY MOUTH THREE TIMES DAILY     Not Delegated - Neurology:  Anticonvulsants - Controlled - pregabalin Failed - 02/28/2024  3:38 PM      Failed - This refill cannot be delegated      Passed - Cr in normal range and within 360 days    Creatinine, Ser  Date Value Ref Range Status  12/22/2023 0.95 0.57 - 1.00 mg/dL Final         Passed - Completed PHQ-2 or PHQ-9 in the last 360 days      Passed - Valid encounter within last 12 months    Recent Outpatient Visits           2 months ago Routine general medical examination at a health care facility   [facility], Megan P, DO   7 months ago Osteoarthritis of spine with radiculopathy, cervical region   [facility] [place], Megan P, DO   8 months ago Osteoarthritis of spine with radiculopathy, cervical region   [facility] Mecum, Oswaldo Conroy, PA-C   9 months ago Osteoarthritis of spine with radiculopathy, cervical region   [facility] [place], Megan P, DO   10 months ago Insomnia, unspecified type   [facility] [place], [place] P, DO                 Requested Prescriptions  Pending Prescriptions Disp Refills   pregabalin (LYRICA) 150 MG capsule [Pharmacy Med Name: PREGABALIN 150MG  CAPSULES] 90 capsule     Sig: TAKE 1 CAPSULE(150 MG) BY MOUTH THREE TIMES DAILY     Not Delegated - Neurology:  Anticonvulsants - Controlled - pregabalin Failed - 02/28/2024  3:38 PM      Failed - This refill cannot be delegated       Passed - Cr in normal range and within 360 days    Creatinine, Ser  Date Value Ref Range Status  12/22/2023 0.95 0.57 - 1.00 mg/dL Final         Passed - Completed PHQ-2 or PHQ-9 in the last 360 days      Passed - Valid encounter within last 12 months    Recent Outpatient Visits           2 months ago Routine general medical examination at a health care facility   [facility], DO   7 months ago Osteoarthritis of spine with radiculopathy, cervical region   [facility] [place], Megan P, DO   8 months ago Osteoarthritis of spine with radiculopathy, cervical region   [facility] Mecum, Oswaldo Conroy, PA-C   9 months ago Osteoarthritis of spine with radiculopathy, cervical region   [facility] [place], Megan P, DO   10 months ago Insomnia, unspecified type    [facility] [place], [place], DO

## 2024-03-23 ENCOUNTER — Other Ambulatory Visit: Payer: Self-pay | Admitting: Family Medicine

## 2024-03-23 DIAGNOSIS — L719 Rosacea, unspecified: Secondary | ICD-10-CM

## 2024-03-26 NOTE — Telephone Encounter (Signed)
 Requested medication (s) are due for refill today: no  Requested medication (s) are on the active medication list: yes  Last refill:  01/13/24  Future visit scheduled: no  Notes to clinic:  Medication not assigned to a protocol, review manually.      Requested Prescriptions  Pending Prescriptions Disp Refills   metroNIDAZOLE  (METROGEL ) 1 % gel [Pharmacy Med Name: METRONIDAZOLE  1% TOPICAL GEL TUBE] 60 g 0    Sig: APPLY TOPICALLY TO THE AFFECTED AREA EVERY DAY     Off-Protocol Failed - 03/26/2024  8:48 AM      Failed - Medication not assigned to a protocol, review manually.      Failed - Valid encounter within last 12 months    Recent Outpatient Visits   None

## 2024-04-17 IMAGING — CT CT HEAD W/O CM
4 series · 16 of 47 positions shown, 18 images · non-contrast
Comparison: None Available.

CLINICAL DATA: Nonspecific dizziness.  Concern for UTI.



[Series 2: head wo · axial · 0.41mm/px · z∈[-88,+27]mm · 7 of 31 slices shown, 9 images]
[im 4/31  brain]
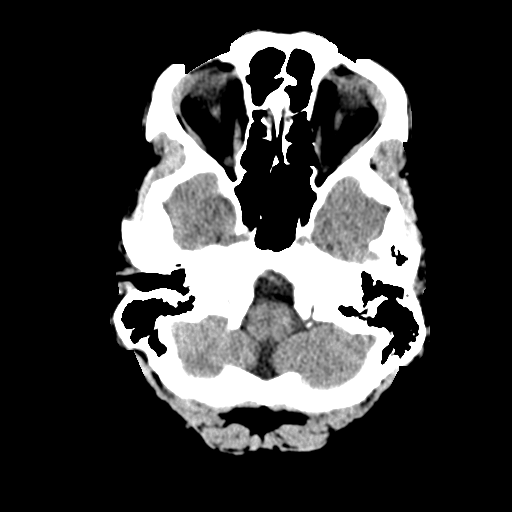
[im 4/31  bone]
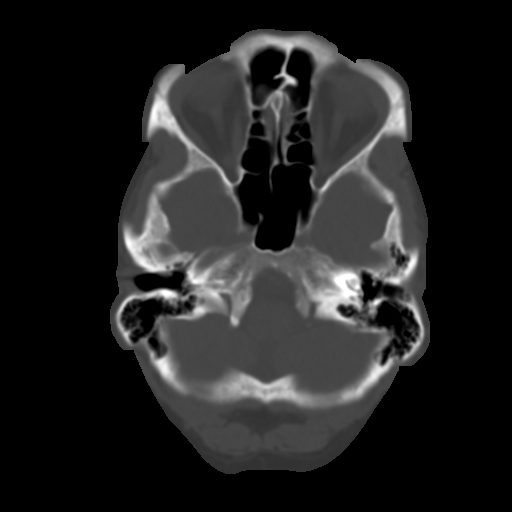
[im 8/31  brain]
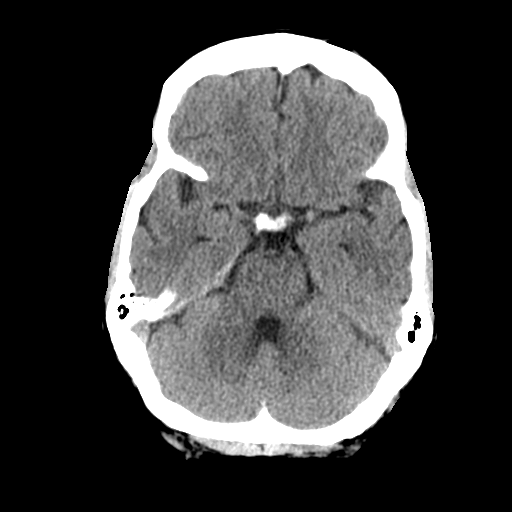
[im 12/31  brain]
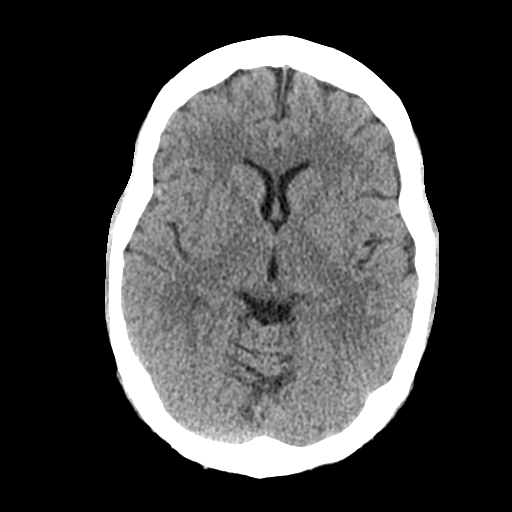
[im 16/31  brain]
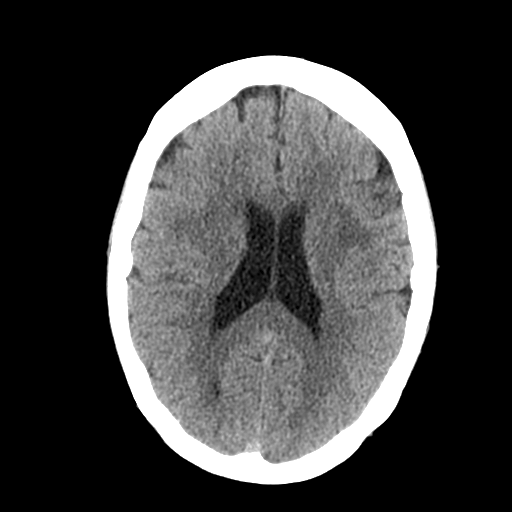
[im 19/31  brain]
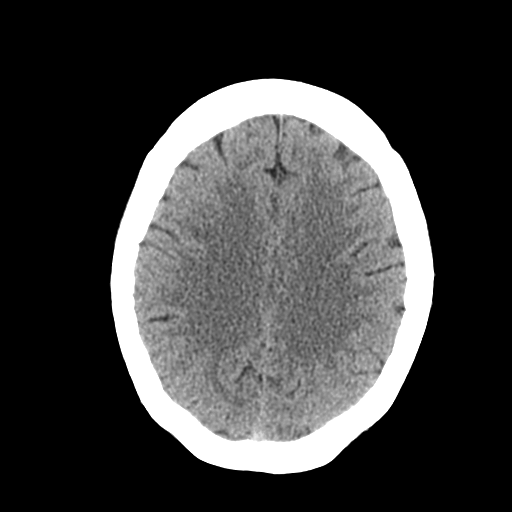
[im 19/31  bone]
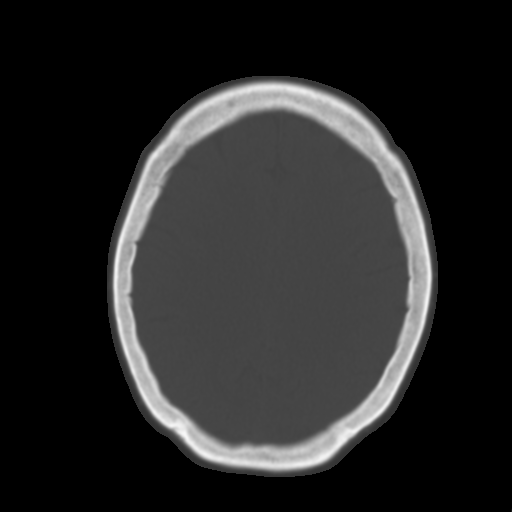
[im 23/31  brain]
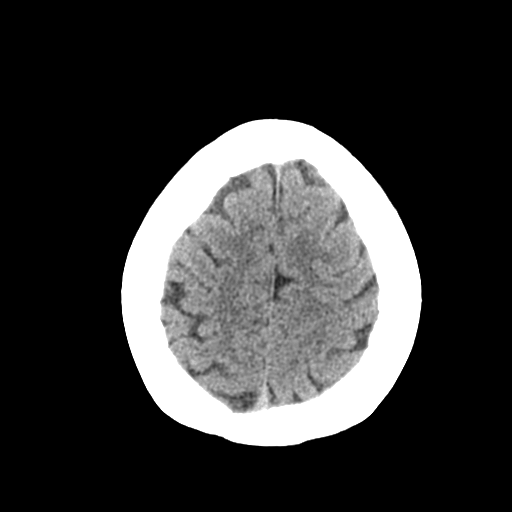
[im 27/31  brain]
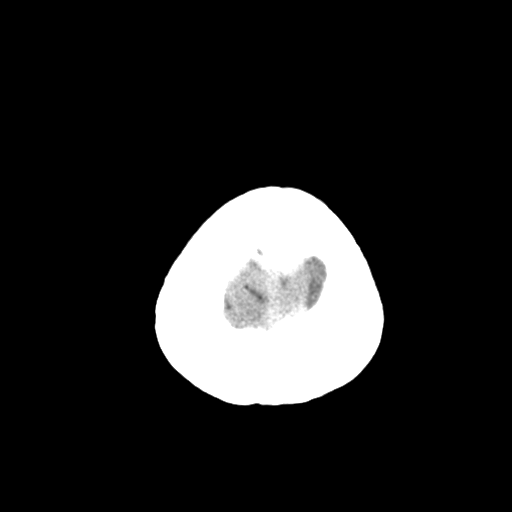

[Series 3: head bone · axial · 0.41mm/px · z∈[-89,-59]mm · 3 of 76 slices shown]
[im 8/76  bone]
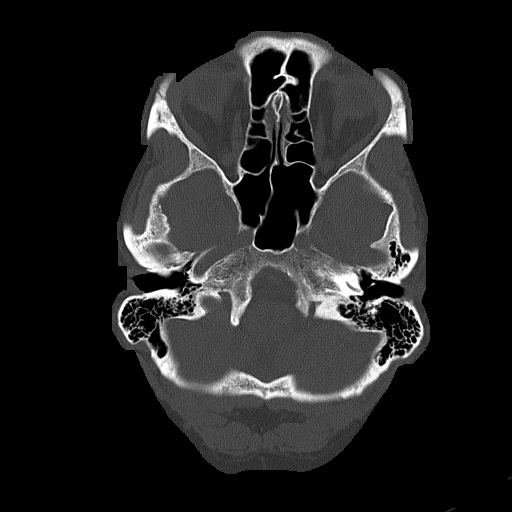
[im 16/76  bone]
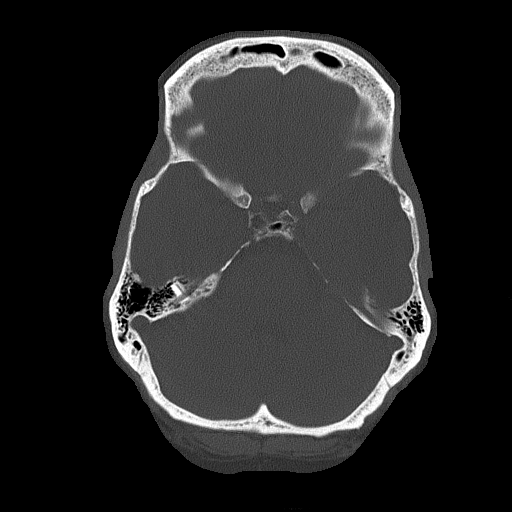
[im 23/76  bone]
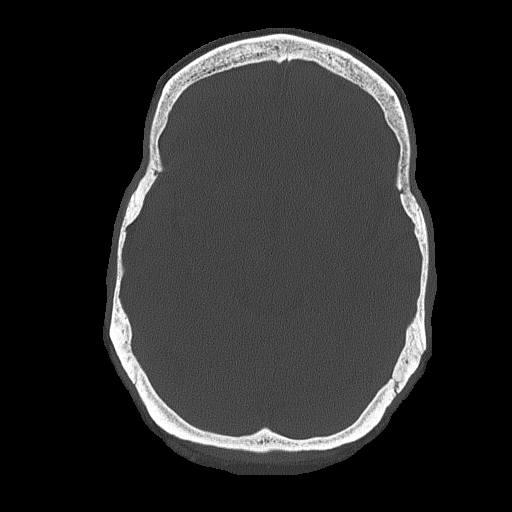

[Series 4: coronal soft tissue · coronal · 0.31mm/px · 3 of 67 slices shown]
[im 23/67  brain]
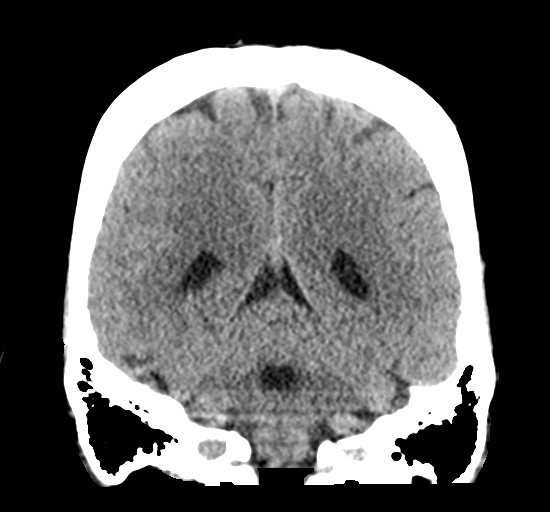
[im 30/67  brain]
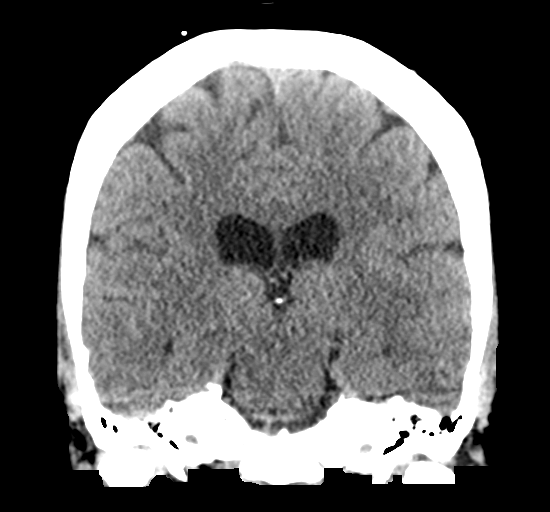
[im 37/67  brain]
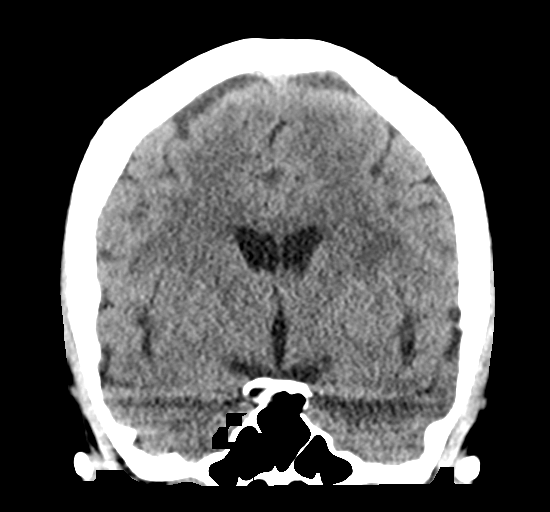

[Series 5: sagittal soft tissue · sagittal · 0.31mm/px · 3 of 58 slices shown]
[im 20/58  brain]
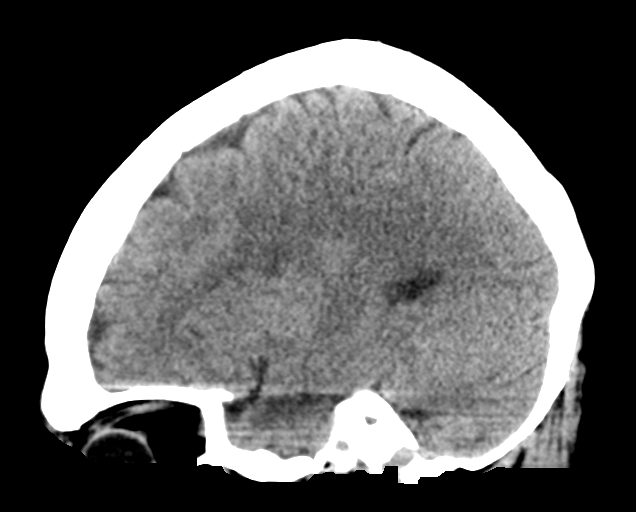
[im 29/58  brain]
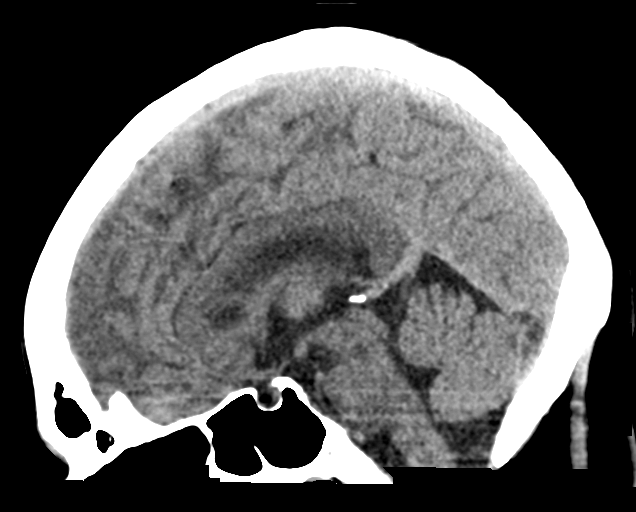
[im 39/58  brain]
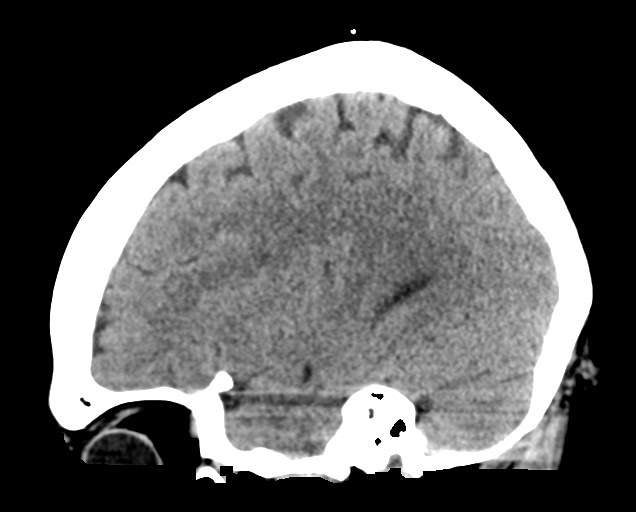

[16 of 47 positions shown; findings below may reference images not displayed]

FINDINGS: Brain: Gray-white differentiation is maintained. No CT evidence of
acute large territory infarct. No intraparenchymal or extra-axial
mass or hemorrhage. Normal size and configuration of the ventricles
and the basilar cisterns. No midline shift.

Vascular: No hyperdense vessel or unexpected calcification.

Skull: No displaced calvarial fracture.

Sinuses/Orbits: Limited visualization the paranasal sinuses and
mastoid air cells is normal. No air-fluid levels.

Other: Regional soft tissues appear normal.
IMPRESSION: Negative noncontrast head CT.

## 2024-05-14 ENCOUNTER — Other Ambulatory Visit: Payer: Self-pay | Admitting: Family Medicine

## 2024-06-02 ENCOUNTER — Other Ambulatory Visit: Payer: Self-pay | Admitting: Family Medicine

## 2024-06-02 DIAGNOSIS — L719 Rosacea, unspecified: Secondary | ICD-10-CM

## 2024-06-04 ENCOUNTER — Other Ambulatory Visit: Payer: Self-pay | Admitting: Family Medicine

## 2024-06-04 NOTE — Telephone Encounter (Signed)
 Called pt and pt does not have a PCP= Pt asking for refill of Metrogel  and Meloxicam .  CPAP: [employer] made pt do a sleep study - This was based on a original referral.   [facility] did Sleep study 11/25/22 on  and pt used a oral device for sleep apnea which subsequently did not work so still needs CPAP.  New sleep study in May 2025- Pt stated was dx severe sleep apnea still does not have CPAP and has not had a CPAP machine for the past year. Pt advised that results had been faxed.   Uses Nasal mask not full face mask.  [employer]  is the name of the company who did the sleep study.- Engineer, [employer] [phone]) Pt commented:. I am I supposed to have CPAP or am I supposed to die young.  Advised pt will forward to former provider.   Notation on last reorder of Meloxicam  indicated that will refill until pt has new PCP.

## 2024-06-05 NOTE — Telephone Encounter (Signed)
 Requested medications are due for refill today.  yes  Requested medications are on the active medications list.  yes  Last refill. 12/25/2023 #90 1 rf  Future visit scheduled.   no  Notes to clinic.  No pcp listed.    Requested Prescriptions  Pending Prescriptions Disp Refills   valACYclovir  (VALTREX ) 1000 MG tablet [Pharmacy Med Name: VALACYCLOVIR  1GM TABLETS] 90 tablet 1    Sig: TAKE 1 TABLET(1000 MG) BY MOUTH DAILY     Antimicrobials:  Antiviral Agents - Anti-Herpetic Failed - 06/05/2024  4:06 PM      Failed - Valid encounter within last 12 months    Recent Outpatient Visits   None

## 2024-06-13 ENCOUNTER — Telehealth: Payer: Self-pay | Admitting: Neurosurgery

## 2024-06-13 NOTE — Telephone Encounter (Signed)
 Patient called and states that Dr. Clois was going to place a referral for a specific provider at [facility] in [place]  and she cannot remember the name. I do not see a referral in her chart. Please advise.

## 2024-06-15 ENCOUNTER — Other Ambulatory Visit: Payer: Self-pay | Admitting: Family Medicine

## 2024-06-15 DIAGNOSIS — R200 Anesthesia of skin: Secondary | ICD-10-CM

## 2024-06-15 NOTE — Telephone Encounter (Signed)
 I spoke to patient and gave the providers name in [place] . I also placed the referral. Patient is aware and will wait on a call to get an appointment.

## 2024-06-17 ENCOUNTER — Other Ambulatory Visit: Payer: Self-pay | Admitting: Family Medicine

## 2024-06-19 NOTE — Telephone Encounter (Signed)
 Requested medication (s) are due for refill today: yes  Requested medication (s) are on the active medication list: yes  Last refill:  12/25/23 #12 1 RF  Future visit scheduled: no  Notes to clinic:  med not delegated to NT to RF   Requested Prescriptions  Pending Prescriptions Disp Refills   Vitamin D , Ergocalciferol , (DRISDOL ) 1.25 MG (50000 UNIT) CAPS capsule [Pharmacy Med Name: VITAMIN D2 50,000IU (ERGO) CAP RX] 12 capsule 1    Sig: TAKE 1 CAPSULE BY MOUTH EVERY 7 DAYS     Endocrinology:  Vitamins - Vitamin D  Supplementation 2 Failed - 06/19/2024 10:30 AM      Failed - Manual Review: Route requests for 50,000 IU strength to the provider      Failed - Vitamin D  in normal range and within 360 days    Vit D, 25-Hydroxy  Date Value Ref Range Status  12/22/2023 22.0 (L) 30.0 - 100.0 ng/mL Final    Comment:    Vitamin D  deficiency has been defined by the Institute of Medicine and an Endocrine Society practice guideline as a level of serum 25-OH vitamin D  less than 20 ng/mL (1,2). The Endocrine Society went on to further define vitamin D  insufficiency as a level between 21 and 29 ng/mL (2). 1. IOM (Institute of Medicine). 2010. Dietary reference    intakes for calcium and D. [place]: The    [employer]. 2. Holick MF, Binkley [place], Bischoff-Ferrari HA, et al.    Evaluation, treatment, and prevention of vitamin D     deficiency: an Endocrine Society clinical practice    guideline. JCEM. 2011 Jul; 96(7):1911-30.          Failed - Valid encounter within last 12 months    Recent Outpatient Visits   None            Passed - Ca in normal range and within 360 days    Calcium  Date Value Ref Range Status  12/22/2023 9.4 8.7 - 10.3 mg/dL Final

## 2024-08-23 ENCOUNTER — Other Ambulatory Visit: Payer: Self-pay | Admitting: Family Medicine

## 2024-08-24 NOTE — Telephone Encounter (Signed)
 Requested medications are due for refill today.  yes  Requested medications are on the active medications list.  yes  Last refill. 12/22/2023 #90 1 rf  Future visit scheduled.   no  Notes to clinic.  No pcp listed, Pt last seen 12/22/2023 - per last refill pt is leaving the area.    Requested Prescriptions  Pending Prescriptions Disp Refills   meloxicam  (MOBIC ) 15 MG tablet [Pharmacy Med Name: MELOXICAM  15MG  TABLETS] 90 tablet 1    Sig: TAKE 1 TABLET(15 MG) BY MOUTH DAILY     Analgesics:  COX2 Inhibitors Failed - 08/24/2024 12:23 PM      Failed - Manual Review: Labs are only required if the patient has taken medication for more than 8 weeks.      Failed - Valid encounter within last 12 months    Recent Outpatient Visits   None            Passed - HGB in normal range and within 360 days    Hemoglobin  Date Value Ref Range Status  12/22/2023 11.6 11.1 - 15.9 g/dL Final         Passed - Cr in normal range and within 360 days    Creatinine, Ser  Date Value Ref Range Status  12/22/2023 0.95 0.57 - 1.00 mg/dL Final         Passed - HCT in normal range and within 360 days    Hematocrit  Date Value Ref Range Status  12/22/2023 35.4 34.0 - 46.6 % Final         Passed - AST in normal range and within 360 days    AST  Date Value Ref Range Status  12/22/2023 15 0 - 40 IU/L Final         Passed - ALT in normal range and within 360 days    ALT  Date Value Ref Range Status  12/22/2023 20 0 - 32 IU/L Final         Passed - eGFR is 30 or above and within 360 days    GFR, Estimated  Date Value Ref Range Status  04/16/2022 >60 >60 mL/min Final    Comment:    (NOTE) Calculated using the CKD-EPI Creatinine Equation (2021)    eGFR  Date Value Ref Range Status  12/22/2023 68 >59 mL/min/1.73 Final         Passed - Patient is not pregnant

## 2024-08-27 ENCOUNTER — Encounter: Payer: Self-pay | Admitting: Family Medicine

## 2024-12-05 ENCOUNTER — Encounter: Payer: Self-pay | Admitting: Family Medicine
